# Patient Record
Sex: Female | Born: 1980 | Race: Black or African American | Hispanic: No | Marital: Single | Smoking: Current every day smoker
Health system: Southern US, Community
[De-identification: ages and names within clinical notes are randomized; demographics above are authoritative.]

## PROBLEM LIST (undated history)

## (undated) HISTORY — PX: ECTOPIC PREGNANCY SURGERY: SHX613

---

## 2000-05-14 ENCOUNTER — Inpatient Hospital Stay (HOSPITAL_COMMUNITY): Admission: AD | Admit: 2000-05-14 | Discharge: 2000-05-14 | Payer: Self-pay | Admitting: *Deleted

## 2001-03-03 ENCOUNTER — Emergency Department (HOSPITAL_COMMUNITY): Admission: EM | Admit: 2001-03-03 | Discharge: 2001-03-03 | Payer: Self-pay | Admitting: Emergency Medicine

## 2001-08-07 ENCOUNTER — Emergency Department (HOSPITAL_COMMUNITY): Admission: EM | Admit: 2001-08-07 | Discharge: 2001-08-07 | Payer: Self-pay | Admitting: Emergency Medicine

## 2004-05-20 ENCOUNTER — Emergency Department (HOSPITAL_COMMUNITY): Admission: EM | Admit: 2004-05-20 | Discharge: 2004-05-20 | Payer: Self-pay | Admitting: Emergency Medicine

## 2007-10-08 ENCOUNTER — Emergency Department (HOSPITAL_COMMUNITY): Admission: EM | Admit: 2007-10-08 | Discharge: 2007-10-09 | Payer: Self-pay | Admitting: Emergency Medicine

## 2009-08-26 ENCOUNTER — Inpatient Hospital Stay (HOSPITAL_COMMUNITY): Admission: AD | Admit: 2009-08-26 | Discharge: 2009-08-26 | Payer: Self-pay | Admitting: Obstetrics and Gynecology

## 2009-08-28 ENCOUNTER — Inpatient Hospital Stay (HOSPITAL_COMMUNITY): Admission: AD | Admit: 2009-08-28 | Discharge: 2009-08-28 | Payer: Self-pay | Admitting: Obstetrics & Gynecology

## 2009-08-28 ENCOUNTER — Ambulatory Visit: Payer: Self-pay | Admitting: Obstetrics & Gynecology

## 2010-08-29 LAB — COMPREHENSIVE METABOLIC PANEL
CO2: 26 mEq/L (ref 19–32)
Calcium: 9 mg/dL (ref 8.4–10.5)
Creatinine, Ser: 0.67 mg/dL (ref 0.4–1.2)
Glucose, Bld: 82 mg/dL (ref 70–99)
Total Protein: 7 g/dL (ref 6.0–8.3)

## 2010-08-29 LAB — DIFFERENTIAL
Eosinophils Absolute: 0.1 10*3/uL (ref 0.0–0.7)
Lymphocytes Relative: 22 % (ref 12–46)
Lymphs Abs: 2.4 10*3/uL (ref 0.7–4.0)
Monocytes Absolute: 1 10*3/uL (ref 0.1–1.0)
Monocytes Relative: 9 % (ref 3–12)
Neutro Abs: 7.3 10*3/uL (ref 1.7–7.7)

## 2010-08-29 LAB — CBC
HCT: 37.5 % (ref 36.0–46.0)
HCT: 38.7 % (ref 36.0–46.0)
Hemoglobin: 12.5 g/dL (ref 12.0–15.0)
MCHC: 34 g/dL (ref 30.0–36.0)
MCV: 86.1 fL (ref 78.0–100.0)
Platelets: 310 10*3/uL (ref 150–400)
Platelets: 312 10*3/uL (ref 150–400)
RBC: 4.32 MIL/uL (ref 3.87–5.11)
RBC: 4.49 MIL/uL (ref 3.87–5.11)
RDW: 13.1 % (ref 11.5–15.5)

## 2010-08-29 LAB — GC/CHLAMYDIA PROBE AMP, GENITAL: GC Probe Amp, Genital: NEGATIVE

## 2010-08-29 LAB — HCG, QUANTITATIVE, PREGNANCY
hCG, Beta Chain, Quant, S: 3645 m[IU]/mL — ABNORMAL HIGH (ref <5)
hCG, Beta Chain, Quant, S: 5236 m[IU]/mL — ABNORMAL HIGH (ref <5)

## 2010-08-29 LAB — WET PREP, GENITAL: Yeast Wet Prep HPF POC: NONE SEEN

## 2011-03-04 LAB — URINALYSIS, ROUTINE W REFLEX MICROSCOPIC
Glucose, UA: NEGATIVE
Hgb urine dipstick: NEGATIVE
Ketones, ur: >80 — AB
Nitrite: NEGATIVE
Protein, ur: 30 — AB
Urobilinogen, UA: 1
pH: 6

## 2011-03-04 LAB — POCT PREGNANCY, URINE: Operator id: 222501

## 2011-03-04 LAB — URINE MICROSCOPIC-ADD ON

## 2012-10-01 ENCOUNTER — Encounter (HOSPITAL_COMMUNITY): Payer: Self-pay

## 2012-10-01 ENCOUNTER — Emergency Department (HOSPITAL_COMMUNITY)
Admission: EM | Admit: 2012-10-01 | Discharge: 2012-10-01 | Disposition: A | Discharge status: Home / Self Care | Payer: BC Managed Care – PPO | Attending: Emergency Medicine | Admitting: Emergency Medicine

## 2012-10-01 DIAGNOSIS — J069 Acute upper respiratory infection, unspecified: Secondary | ICD-10-CM

## 2012-10-01 DIAGNOSIS — IMO0001 Reserved for inherently not codable concepts without codable children: Secondary | ICD-10-CM | POA: Insufficient documentation

## 2012-10-01 DIAGNOSIS — H9209 Otalgia, unspecified ear: Secondary | ICD-10-CM | POA: Insufficient documentation

## 2012-10-01 DIAGNOSIS — R5381 Other malaise: Secondary | ICD-10-CM | POA: Insufficient documentation

## 2012-10-01 DIAGNOSIS — J3489 Other specified disorders of nose and nasal sinuses: Secondary | ICD-10-CM | POA: Insufficient documentation

## 2012-10-01 DIAGNOSIS — J029 Acute pharyngitis, unspecified: Secondary | ICD-10-CM | POA: Insufficient documentation

## 2012-10-01 MED ORDER — GUAIFENESIN-CODEINE 100-10 MG/5ML PO SYRP: 5.0000 mL | ORAL_SOLUTION | Freq: Three times a day (TID) | ORAL | Status: DC | PRN

## 2012-10-01 MED ORDER — AZITHROMYCIN 250 MG PO TABS: 250.0000 mg | ORAL_TABLET | Freq: Every day | ORAL | Status: DC

## 2012-10-01 NOTE — ED Provider Notes (Signed)
History     CSN: [id number]  Arrival date & time 10/01/12  1308   First MD Initiated Contact with Patient 10/01/12 1314      Chief Complaint  Patient presents with  . URI    (Consider location/radiation/quality/duration/timing/severity/associated sxs/prior treatment) Patient is a 32 y.o. female presenting with URI. The history is provided by the patient.  URI Presenting symptoms: congestion, cough, ear pain, fatigue and sore throat   Severity:  Moderate Onset quality:  Gradual Duration:  4 days Timing:  Constant Progression:  Worsening Chronicity:  New Relieved by:  Nothing Worsened by:  Nothing tried Ineffective treatments:  Decongestant Associated symptoms: myalgias   Associated symptoms: no neck pain and no sinus pain     No past medical history on file.  Past Surgical History  Procedure Laterality Date  . Ectopic pregnancy surgery      No family history on file.  History  Substance Use Topics  . Smoking status: Not on file  . Smokeless tobacco: Not on file  . Alcohol Use: Not on file    OB History   Grav Para Term Preterm Abortions TAB SAB Ect Mult Living                  Review of Systems  Constitutional: Positive for fatigue.  HENT: Positive for ear pain, congestion and sore throat. Negative for neck pain.   Respiratory: Positive for cough.   Musculoskeletal: Positive for myalgias.  All other systems reviewed and are negative.    Allergies  Penicillins  Home Medications   Current Outpatient Rx  Name  Route  Sig  Dispense  Refill  . Alum & Mag Hydroxide-Simeth (MAGIC MOUTHWASH) SOLN   Oral   Take 10 mLs by mouth daily.         Marland Kitchen guaiFENesin (MUCINEX) 600 MG 12 hr tablet   Oral   Take 1,200 mg by mouth as needed for congestion.         Marland Kitchen ibuprofen (ADVIL,MOTRIN) 200 MG tablet   Oral   Take 200 mg by mouth every 6 (six) hours as needed for pain.         Marland Kitchen azithromycin (ZITHROMAX) 250 MG tablet   Oral   Take 1 tablet (250 mg  total) by mouth daily. Take first 2 tablets together, then 1 every day until finished.   6 tablet   0   . guaiFENesin-codeine (ROBITUSSIN AC) 100-10 MG/5ML syrup   Oral   Take 5 mLs by mouth 3 (three) times daily as needed for cough.   120 mL   0     BP 111/69  Pulse 110  Temp(Src) 99.8 F (37.7 C) (Oral)  SpO2 100%  LMP 09/24/2012  Physical Exam  Nursing note and vitals reviewed. Constitutional: She is oriented to person, place, and time. She appears well-developed and well-nourished. No distress.  HENT:  Head: Normocephalic and atraumatic.  Neck: Normal range of motion. Neck supple.  Cardiovascular: Normal rate and regular rhythm.  Exam reveals no gallop and no friction rub.   No murmur heard. Pulmonary/Chest: Effort normal and breath sounds normal. No respiratory distress. She has no wheezes.  Abdominal: Soft. Bowel sounds are normal. She exhibits no distension. There is no tenderness.  Musculoskeletal: Normal range of motion.  Neurological: She is alert and oriented to person, place, and time.  Skin: Skin is warm and dry. She is not diaphoretic.    ED Course  Procedures (including critical care time)  Labs  Reviewed - No data to display No results found.   1. URI (upper respiratory infection)       MDM  Symptoms likely viral in nature.  Will treat with robitussin ac, continue with decongestants.  Mom is concerned that she will not improve without an antibiotic.  Will prescribe zmax, to fill only if not improving in the next 2-3 days.        Geoffery Lyons, MD 10/01/12 [phone]

## 2012-10-01 NOTE — ED Notes (Signed)
She c/o occasional chills, plus cough productive of green phlegm x 3 days.  She is in no distress.

## 2014-01-02 ENCOUNTER — Observation Stay (HOSPITAL_COMMUNITY): Payer: BC Managed Care – PPO | Admitting: Anesthesiology

## 2014-01-02 ENCOUNTER — Observation Stay (HOSPITAL_COMMUNITY): Payer: BC Managed Care – PPO

## 2014-01-02 ENCOUNTER — Encounter (HOSPITAL_COMMUNITY)
Admission: EM | Disposition: A | Discharge status: Home / Self Care | Payer: Self-pay | Source: Home / Self Care | Attending: Orthopedic Surgery

## 2014-01-02 ENCOUNTER — Encounter (HOSPITAL_COMMUNITY): Payer: Self-pay | Admitting: Emergency Medicine

## 2014-01-02 ENCOUNTER — Emergency Department (HOSPITAL_COMMUNITY): Payer: BC Managed Care – PPO

## 2014-01-02 ENCOUNTER — Encounter (HOSPITAL_COMMUNITY): Payer: BC Managed Care – PPO | Admitting: Certified Registered Nurse Anesthetist

## 2014-01-02 ENCOUNTER — Inpatient Hospital Stay (HOSPITAL_COMMUNITY)
Admission: EM | Admit: 2014-01-02 | Discharge: 2014-01-05 | DRG: 504 | Disposition: A | Discharge status: Home / Self Care | Payer: BC Managed Care – PPO | Attending: Orthopedic Surgery | Admitting: Orthopedic Surgery

## 2014-01-02 ENCOUNTER — Observation Stay (HOSPITAL_COMMUNITY): Payer: BC Managed Care – PPO | Admitting: Certified Registered Nurse Anesthetist

## 2014-01-02 ENCOUNTER — Encounter (HOSPITAL_COMMUNITY): Payer: BC Managed Care – PPO | Admitting: Anesthesiology

## 2014-01-02 DIAGNOSIS — F1010 Alcohol abuse, uncomplicated: Secondary | ICD-10-CM | POA: Diagnosis present

## 2014-01-02 DIAGNOSIS — F10929 Alcohol use, unspecified with intoxication, unspecified: Secondary | ICD-10-CM | POA: Diagnosis present

## 2014-01-02 DIAGNOSIS — D62 Acute posthemorrhagic anemia: Secondary | ICD-10-CM | POA: Diagnosis not present

## 2014-01-02 DIAGNOSIS — S92101A Unspecified fracture of right talus, initial encounter for closed fracture: Secondary | ICD-10-CM

## 2014-01-02 DIAGNOSIS — S91009A Unspecified open wound, unspecified ankle, initial encounter: Secondary | ICD-10-CM

## 2014-01-02 DIAGNOSIS — S81011A Laceration without foreign body, right knee, initial encounter: Secondary | ICD-10-CM

## 2014-01-02 DIAGNOSIS — S81809A Unspecified open wound, unspecified lower leg, initial encounter: Secondary | ICD-10-CM

## 2014-01-02 DIAGNOSIS — Z880 Allergy status to penicillin: Secondary | ICD-10-CM

## 2014-01-02 DIAGNOSIS — S92109A Unspecified fracture of unspecified talus, initial encounter for closed fracture: Principal | ICD-10-CM

## 2014-01-02 DIAGNOSIS — F10920 Alcohol use, unspecified with intoxication, uncomplicated: Secondary | ICD-10-CM

## 2014-01-02 DIAGNOSIS — F17200 Nicotine dependence, unspecified, uncomplicated: Secondary | ICD-10-CM | POA: Diagnosis present

## 2014-01-02 DIAGNOSIS — S81009A Unspecified open wound, unspecified knee, initial encounter: Secondary | ICD-10-CM | POA: Diagnosis present

## 2014-01-02 HISTORY — PX: ORIF ANKLE FRACTURE: SHX5408

## 2014-01-02 LAB — CBC WITH DIFFERENTIAL/PLATELET
BASOS ABS: 0.1 10*3/uL (ref 0.0–0.1)
Basophils Relative: 0 % (ref 0–1)
EOS ABS: 0.1 10*3/uL (ref 0.0–0.7)
Eosinophils Relative: 1 % (ref 0–5)
HCT: 39.2 % (ref 36.0–46.0)
Hemoglobin: 13.9 g/dL (ref 12.0–15.0)
LYMPHS ABS: 5.8 10*3/uL — AB (ref 0.7–4.0)
Lymphocytes Relative: 51 % — ABNORMAL HIGH (ref 12–46)
MCH: 29.5 pg (ref 26.0–34.0)
MCHC: 35.5 g/dL (ref 30.0–36.0)
MCV: 83.2 fL (ref 78.0–100.0)
Monocytes Absolute: 0.8 10*3/uL (ref 0.1–1.0)
Monocytes Relative: 7 % (ref 3–12)
NEUTROS PCT: 41 % — AB (ref 43–77)
Neutro Abs: 4.6 10*3/uL (ref 1.7–7.7)
PLATELETS: 346 10*3/uL (ref 150–400)
RBC: 4.71 MIL/uL (ref 3.87–5.11)
RDW: 13 % (ref 11.5–15.5)
WBC: 11.4 10*3/uL — AB (ref 4.0–10.5)

## 2014-01-02 LAB — SURGICAL PCR SCREEN
MRSA, PCR: NEGATIVE
Staphylococcus aureus: NEGATIVE

## 2014-01-02 LAB — URINALYSIS, ROUTINE W REFLEX MICROSCOPIC
Bilirubin Urine: NEGATIVE
Glucose, UA: NEGATIVE mg/dL
Ketones, ur: NEGATIVE mg/dL
Leukocytes, UA: NEGATIVE
NITRITE: NEGATIVE
Protein, ur: NEGATIVE mg/dL
SPECIFIC GRAVITY, URINE: 1.011 (ref 1.005–1.030)
UROBILINOGEN UA: 0.2 mg/dL (ref 0.0–1.0)
pH: 6 (ref 5.0–8.0)

## 2014-01-02 LAB — BASIC METABOLIC PANEL
ANION GAP: 17 — AB (ref 5–15)
BUN: 11 mg/dL (ref 6–23)
CALCIUM: 8.8 mg/dL (ref 8.4–10.5)
CO2: 24 mEq/L (ref 19–32)
Chloride: 104 mEq/L (ref 96–112)
Creatinine, Ser: 0.65 mg/dL (ref 0.50–1.10)
GFR calc Af Amer: >90 mL/min (ref >90)
GFR calc non Af Amer: >90 mL/min (ref >90)
GLUCOSE: 94 mg/dL (ref 70–99)
POTASSIUM: 4 meq/L (ref 3.7–5.3)
SODIUM: 145 meq/L (ref 137–147)

## 2014-01-02 LAB — URINE MICROSCOPIC-ADD ON

## 2014-01-02 LAB — ETHANOL: Alcohol, Ethyl (B): 226 mg/dL — ABNORMAL HIGH (ref 0–11)

## 2014-01-02 LAB — HCG, SERUM, QUALITATIVE: PREG SERUM: NEGATIVE

## 2014-01-02 LAB — PREGNANCY, URINE: PREG TEST UR: NEGATIVE

## 2014-01-02 SURGERY — CLOSED REDUCTION, ANKLE
Anesthesia: General | Site: Ankle | Laterality: Right

## 2014-01-02 SURGERY — OPEN REDUCTION INTERNAL FIXATION (ORIF) ANKLE FRACTURE
Anesthesia: General | Laterality: Right

## 2014-01-02 MED ORDER — METHOCARBAMOL 1000 MG/10ML IJ SOLN
500.0000 mg | Freq: Four times a day (QID) | INTRAMUSCULAR | Status: DC | PRN
  Filled 2014-01-02: qty 10

## 2014-01-02 MED ORDER — ONDANSETRON HCL 4 MG/2ML IJ SOLN
4.0000 mg | Freq: Four times a day (QID) | INTRAMUSCULAR | Status: DC | PRN
Start: 2014-01-02 — End: 2014-01-05
  Administered 2014-01-02: 4 mg via INTRAVENOUS

## 2014-01-02 MED ORDER — NALOXONE HCL 0.4 MG/ML IJ SOLN: 0.4000 mg | INTRAMUSCULAR | Status: DC | PRN

## 2014-01-02 MED ORDER — HYDROMORPHONE HCL PF 1 MG/ML IJ SOLN
0.2500 mg | INTRAMUSCULAR | Status: DC | PRN
  Administered 2014-01-02 (×4): 0.5 mg via INTRAVENOUS

## 2014-01-02 MED ORDER — MORPHINE SULFATE (PF) 1 MG/ML IV SOLN
INTRAVENOUS | Status: DC
  Administered 2014-01-02: 21:00:00 via INTRAVENOUS
  Administered 2014-01-02: 5 mg via INTRAVENOUS
  Administered 2014-01-02: 14:00:00 via INTRAVENOUS
  Administered 2014-01-03: 25 mg via INTRAVENOUS
  Administered 2014-01-03: 1 mg via INTRAVENOUS
  Administered 2014-01-03: 04:00:00 via INTRAVENOUS
  Administered 2014-01-03: 1 mg via INTRAVENOUS
  Administered 2014-01-04: 15 mg via INTRAVENOUS
  Administered 2014-01-04: via INTRAVENOUS
  Filled 2014-01-02 (×4): qty 25

## 2014-01-02 MED ORDER — ACETAMINOPHEN 10 MG/ML IV SOLN
1000.0000 mg | Freq: Four times a day (QID) | INTRAVENOUS | Status: AC
  Administered 2014-01-02 – 2014-01-03 (×4): 1000 mg via INTRAVENOUS
  Filled 2014-01-02 (×2): qty 100

## 2014-01-02 MED ORDER — HYDROMORPHONE HCL PF 1 MG/ML IJ SOLN
1.0000 mg | Freq: Once | INTRAMUSCULAR | Status: AC
  Administered 2014-01-02: 1 mg via INTRAVENOUS
  Filled 2014-01-02: qty 1

## 2014-01-02 MED ORDER — MAGNESIUM CITRATE PO SOLN: 1.0000 | Freq: Once | ORAL | Status: AC | PRN

## 2014-01-02 MED ORDER — CEFAZOLIN SODIUM-DEXTROSE 2-3 GM-% IV SOLR
2.0000 g | Freq: Once | INTRAVENOUS | Status: AC
  Administered 2014-01-02: 2 g via INTRAVENOUS
  Filled 2014-01-02: qty 50

## 2014-01-02 MED ORDER — LACTATED RINGERS IV SOLN
INTRAVENOUS | Status: DC | PRN
  Administered 2014-01-02 (×3): via INTRAVENOUS

## 2014-01-02 MED ORDER — DOCUSATE SODIUM 100 MG PO CAPS
100.0000 mg | ORAL_CAPSULE | Freq: Two times a day (BID) | ORAL | Status: DC
  Administered 2014-01-02 – 2014-01-05 (×6): 100 mg via ORAL
  Filled 2014-01-02 (×8): qty 1

## 2014-01-02 MED ORDER — DIPHENHYDRAMINE HCL 12.5 MG/5ML PO ELIX
12.5000 mg | ORAL_SOLUTION | Freq: Four times a day (QID) | ORAL | Status: DC | PRN
  Administered 2014-01-04: 25 mg via ORAL
  Filled 2014-01-02: qty 10

## 2014-01-02 MED ORDER — METHYLENE BLUE 1 % INJ SOLN
INTRAMUSCULAR | Status: DC | PRN
  Administered 2014-01-02: 10 mL via SUBMUCOSAL

## 2014-01-02 MED ORDER — ROCURONIUM BROMIDE 100 MG/10ML IV SOLN
INTRAVENOUS | Status: DC | PRN
  Administered 2014-01-02: 10 mg via INTRAVENOUS
  Administered 2014-01-02: 30 mg via INTRAVENOUS
  Administered 2014-01-02: 10 mg via INTRAVENOUS
  Administered 2014-01-02: 20 mg via INTRAVENOUS

## 2014-01-02 MED ORDER — LIDOCAINE HCL (CARDIAC) 20 MG/ML IV SOLN
INTRAVENOUS | Status: DC | PRN
  Administered 2014-01-02: 100 mg via INTRAVENOUS

## 2014-01-02 MED ORDER — POLYETHYLENE GLYCOL 3350 17 G PO PACK: 17.0000 g | PACK | Freq: Every day | ORAL | Status: DC | PRN

## 2014-01-02 MED ORDER — DIPHENHYDRAMINE HCL 50 MG/ML IJ SOLN: 12.5000 mg | Freq: Four times a day (QID) | INTRAMUSCULAR | Status: DC | PRN

## 2014-01-02 MED ORDER — BISACODYL 5 MG PO TBEC: 5.0000 mg | DELAYED_RELEASE_TABLET | Freq: Every day | ORAL | Status: DC | PRN

## 2014-01-02 MED ORDER — GLYCOPYRROLATE 0.2 MG/ML IJ SOLN
INTRAMUSCULAR | Status: DC | PRN
  Administered 2014-01-02: 0.2 mg via INTRAVENOUS

## 2014-01-02 MED ORDER — ONDANSETRON HCL 4 MG/2ML IJ SOLN
4.0000 mg | Freq: Four times a day (QID) | INTRAMUSCULAR | Status: DC | PRN
  Filled 2014-01-02: qty 2

## 2014-01-02 MED ORDER — FENTANYL CITRATE 0.05 MG/ML IJ SOLN
INTRAMUSCULAR | Status: AC
  Filled 2014-01-02: qty 2

## 2014-01-02 MED ORDER — FENTANYL CITRATE 0.05 MG/ML IJ SOLN
INTRAMUSCULAR | Status: DC | PRN
  Administered 2014-01-02: 25 ug via INTRAVENOUS
  Administered 2014-01-02: 100 ug via INTRAVENOUS
  Administered 2014-01-02 (×3): 25 ug via INTRAVENOUS
  Administered 2014-01-02: 50 ug via INTRAVENOUS

## 2014-01-02 MED ORDER — FENTANYL CITRATE 0.05 MG/ML IJ SOLN
INTRAMUSCULAR | Status: AC | PRN
  Administered 2014-01-02 (×2): 50 ug via INTRAVENOUS

## 2014-01-02 MED ORDER — OXYCODONE HCL 5 MG PO TABS
5.0000 mg | ORAL_TABLET | ORAL | Status: DC | PRN
  Administered 2014-01-03 – 2014-01-04 (×2): 10 mg via ORAL
  Filled 2014-01-02 (×2): qty 2

## 2014-01-02 MED ORDER — MORPHINE SULFATE (PF) 1 MG/ML IV SOLN
INTRAVENOUS | Status: AC
  Filled 2014-01-02: qty 25

## 2014-01-02 MED ORDER — ONDANSETRON HCL 4 MG/2ML IJ SOLN: 4.0000 mg | Freq: Four times a day (QID) | INTRAMUSCULAR | Status: DC | PRN

## 2014-01-02 MED ORDER — METOCLOPRAMIDE HCL 10 MG PO TABS: 5.0000 mg | ORAL_TABLET | Freq: Three times a day (TID) | ORAL | Status: DC | PRN

## 2014-01-02 MED ORDER — METOCLOPRAMIDE HCL 5 MG/ML IJ SOLN: 5.0000 mg | Freq: Three times a day (TID) | INTRAMUSCULAR | Status: DC | PRN

## 2014-01-02 MED ORDER — ONDANSETRON HCL 4 MG/2ML IJ SOLN
INTRAMUSCULAR | Status: DC | PRN
  Administered 2014-01-02: 4 mg via INTRAVENOUS

## 2014-01-02 MED ORDER — PROPOFOL 10 MG/ML IV BOLUS
INTRAVENOUS | Status: DC | PRN
  Administered 2014-01-02: 160 mg via INTRAVENOUS

## 2014-01-02 MED ORDER — HYDROMORPHONE HCL PF 1 MG/ML IJ SOLN: 1.0000 mg | INTRAMUSCULAR | Status: DC | PRN

## 2014-01-02 MED ORDER — MIDAZOLAM HCL 2 MG/2ML IJ SOLN
INTRAMUSCULAR | Status: AC
  Filled 2014-01-02: qty 2

## 2014-01-02 MED ORDER — POTASSIUM CHLORIDE IN NACL 20-0.9 MEQ/L-% IV SOLN
INTRAVENOUS | Status: DC
  Administered 2014-01-03: 13:00:00 via INTRAVENOUS
  Filled 2014-01-02 (×3): qty 1000

## 2014-01-02 MED ORDER — PNEUMOCOCCAL VAC POLYVALENT 25 MCG/0.5ML IJ INJ
0.5000 mL | INJECTION | INTRAMUSCULAR | Status: AC
  Administered 2014-01-03: 0.5 mL via INTRAMUSCULAR
  Filled 2014-01-02 (×2): qty 0.5

## 2014-01-02 MED ORDER — PHENYLEPHRINE HCL 10 MG/ML IJ SOLN
INTRAMUSCULAR | Status: DC | PRN
  Administered 2014-01-02 (×3): 80 ug via INTRAVENOUS
  Administered 2014-01-02: 40 ug via INTRAVENOUS
  Administered 2014-01-02: 80 ug via INTRAVENOUS
  Administered 2014-01-02: 40 ug via INTRAVENOUS

## 2014-01-02 MED ORDER — METHOCARBAMOL 500 MG PO TABS
500.0000 mg | ORAL_TABLET | Freq: Four times a day (QID) | ORAL | Status: DC | PRN
  Administered 2014-01-02: 1000 mg via ORAL
  Administered 2014-01-03: 500 mg via ORAL
  Administered 2014-01-04 (×3): 1000 mg via ORAL
  Administered 2014-01-04 – 2014-01-05 (×3): 500 mg via ORAL
  Filled 2014-01-02 (×3): qty 2
  Filled 2014-01-02 (×2): qty 1
  Filled 2014-01-02: qty 2
  Filled 2014-01-02 (×2): qty 1

## 2014-01-02 MED ORDER — CEFAZOLIN SODIUM 1-5 GM-% IV SOLN
1.0000 g | Freq: Four times a day (QID) | INTRAVENOUS | Status: AC
  Administered 2014-01-02 – 2014-01-03 (×3): 1 g via INTRAVENOUS
  Filled 2014-01-02: qty 50

## 2014-01-02 MED ORDER — SUCCINYLCHOLINE CHLORIDE 20 MG/ML IJ SOLN
INTRAMUSCULAR | Status: DC | PRN
  Administered 2014-01-02: 80 mg via INTRAVENOUS

## 2014-01-02 MED ORDER — VALACYCLOVIR HCL 500 MG PO TABS
500.0000 mg | ORAL_TABLET | Freq: Every day | ORAL | Status: DC
  Administered 2014-01-02 – 2014-01-05 (×4): 500 mg via ORAL
  Filled 2014-01-02 (×4): qty 1

## 2014-01-02 MED ORDER — 0.9 % SODIUM CHLORIDE (POUR BTL) OPTIME
TOPICAL | Status: DC | PRN
  Administered 2014-01-02: 1000 mL

## 2014-01-02 MED ORDER — SODIUM CHLORIDE 0.9 % IJ SOLN: 9.0000 mL | INTRAMUSCULAR | Status: DC | PRN

## 2014-01-02 MED ORDER — METHYLENE BLUE 1 % INJ SOLN
INTRAMUSCULAR | Status: AC
  Filled 2014-01-02: qty 10

## 2014-01-02 MED ORDER — ENOXAPARIN SODIUM 40 MG/0.4ML ~~LOC~~ SOLN
40.0000 mg | SUBCUTANEOUS | Status: DC
  Administered 2014-01-03 – 2014-01-04 (×2): 40 mg via SUBCUTANEOUS
  Filled 2014-01-02 (×3): qty 0.4

## 2014-01-02 MED ORDER — HYDROMORPHONE HCL PF 1 MG/ML IJ SOLN
INTRAMUSCULAR | Status: AC
  Administered 2014-01-02: 0.5 mg via INTRAVENOUS
  Filled 2014-01-02: qty 2

## 2014-01-02 MED ORDER — ACETAMINOPHEN 10 MG/ML IV SOLN
INTRAVENOUS | Status: AC
  Filled 2014-01-02: qty 100

## 2014-01-02 MED ORDER — NEOSTIGMINE METHYLSULFATE 10 MG/10ML IV SOLN
INTRAVENOUS | Status: DC | PRN
  Administered 2014-01-02: 2 mg via INTRAVENOUS

## 2014-01-02 MED ORDER — BUPIVACAINE HCL (PF) 0.25 % IJ SOLN
INTRAMUSCULAR | Status: AC
  Filled 2014-01-02: qty 30

## 2014-01-02 MED ORDER — FENTANYL CITRATE 0.05 MG/ML IJ SOLN
INTRAMUSCULAR | Status: AC
  Filled 2014-01-02: qty 5

## 2014-01-02 MED ORDER — ONDANSETRON HCL 4 MG/2ML IJ SOLN: 4.0000 mg | Freq: Once | INTRAMUSCULAR | Status: DC | PRN

## 2014-01-02 MED ORDER — ONDANSETRON HCL 4 MG PO TABS
4.0000 mg | ORAL_TABLET | Freq: Four times a day (QID) | ORAL | Status: DC | PRN
  Administered 2014-01-03: 4 mg via ORAL
  Filled 2014-01-02: qty 1

## 2014-01-02 MED ORDER — PROPOFOL 10 MG/ML IV BOLUS
INTRAVENOUS | Status: AC
  Filled 2014-01-02: qty 20

## 2014-01-02 MED ORDER — CEFAZOLIN SODIUM 1-5 GM-% IV SOLN
INTRAVENOUS | Status: AC
  Filled 2014-01-02: qty 50

## 2014-01-02 SURGICAL SUPPLY — 72 items
BANDAGE ELASTIC 4 VELCRO ST LF (GAUZE/BANDAGES/DRESSINGS) ×3 IMPLANT
BANDAGE ELASTIC 6 VELCRO ST LF (GAUZE/BANDAGES/DRESSINGS) ×2 IMPLANT
BANDAGE ESMARK 6X9 LF (GAUZE/BANDAGES/DRESSINGS) ×1 IMPLANT
BANDAGE GAUZE ELAST BULKY 4 IN (GAUZE/BANDAGES/DRESSINGS) IMPLANT
BIT DRILL 100X2XQC STRL (BIT) IMPLANT
BIT DRILL 110MM 85MM (BIT) IMPLANT
BIT DRILL QC 2.0X100 (BIT) ×3
BIT DRL 100X2XQC STRL (BIT) ×1
BLADE SURG 10 STRL SS (BLADE) ×3 IMPLANT
BNDG CMPR 9X6 STRL LF SNTH (GAUZE/BANDAGES/DRESSINGS) ×1
BNDG COHESIVE 4X5 TAN STRL (GAUZE/BANDAGES/DRESSINGS) ×3 IMPLANT
BNDG ESMARK 6X9 LF (GAUZE/BANDAGES/DRESSINGS) ×3
BNDG GAUZE ELAST 4 BULKY (GAUZE/BANDAGES/DRESSINGS) ×2 IMPLANT
BRUSH SCRUB DISP (MISCELLANEOUS) ×6 IMPLANT
COVER SURGICAL LIGHT HANDLE (MISCELLANEOUS) ×6 IMPLANT
CUFF TOURNIQUET SINGLE 34IN LL (TOURNIQUET CUFF) ×3 IMPLANT
DRAPE C-ARM 42X72 X-RAY (DRAPES) IMPLANT
DRAPE C-ARMOR (DRAPES) ×6 IMPLANT
DRAPE ORTHO SPLIT 77X108 STRL (DRAPES) ×9
DRAPE PROXIMA HALF (DRAPES) ×3 IMPLANT
DRAPE SURG ORHT 6 SPLT 77X108 (DRAPES) ×3 IMPLANT
DRAPE U-SHAPE 47X51 STRL (DRAPES) ×3 IMPLANT
DRILL BIT 110MM/85MM (BIT) ×2
DRSG EMULSION OIL 3X3 NADH (GAUZE/BANDAGES/DRESSINGS) IMPLANT
DRSG MEPITEL 4X7.2 (GAUZE/BANDAGES/DRESSINGS) ×2 IMPLANT
ELECT REM PT RETURN 9FT ADLT (ELECTROSURGICAL) ×3
ELECTRODE REM PT RTRN 9FT ADLT (ELECTROSURGICAL) ×1 IMPLANT
GLOVE BIO SURGEON STRL SZ7.5 (GLOVE) ×3 IMPLANT
GLOVE BIO SURGEON STRL SZ8 (GLOVE) ×3 IMPLANT
GLOVE BIOGEL PI IND STRL 7.5 (GLOVE) ×1 IMPLANT
GLOVE BIOGEL PI IND STRL 8 (GLOVE) ×1 IMPLANT
GLOVE BIOGEL PI INDICATOR 7.5 (GLOVE) ×2
GLOVE BIOGEL PI INDICATOR 8 (GLOVE) ×2
GOWN STRL REUS W/ TWL LRG LVL3 (GOWN DISPOSABLE) ×2 IMPLANT
GOWN STRL REUS W/ TWL XL LVL3 (GOWN DISPOSABLE) ×1 IMPLANT
GOWN STRL REUS W/TWL LRG LVL3 (GOWN DISPOSABLE) ×6
GOWN STRL REUS W/TWL XL LVL3 (GOWN DISPOSABLE) ×3
K-WIRE 1.25 TRCR POINT 150 (WIRE) ×15
KIT BASIN OR (CUSTOM PROCEDURE TRAY) ×3 IMPLANT
KIT ROOM TURNOVER OR (KITS) ×3 IMPLANT
KWIRE 1.25 TRCR POINT 150 (WIRE) ×5 IMPLANT
MANIFOLD NEPTUNE II (INSTRUMENTS) ×3 IMPLANT
NEEDLE HYPO 21X1.5 SAFETY (NEEDLE) IMPLANT
NS IRRIG 1000ML POUR BTL (IV SOLUTION) ×3 IMPLANT
PACK GENERAL/GYN (CUSTOM PROCEDURE TRAY) ×3 IMPLANT
PAD ARMBOARD 7.5X6 YLW CONV (MISCELLANEOUS) ×6 IMPLANT
PAD CAST 4YDX4 CTTN HI CHSV (CAST SUPPLIES) ×1 IMPLANT
PADDING CAST COTTON 4X4 STRL (CAST SUPPLIES) ×3
PADDING CAST COTTON 6X4 STRL (CAST SUPPLIES) ×3 IMPLANT
PENCIL BUTTON HOLSTER BLD 10FT (ELECTRODE) ×3 IMPLANT
PLATE CONDYLAR 2.0 7H 39M L (Plate) ×6 IMPLANT
SCREW CORTEX 2.0 24MM (Screw) ×3 IMPLANT
SCREW CORTEX 2.0 26MM (Screw) ×12 IMPLANT
SPONGE GAUZE 4X4 12PLY (GAUZE/BANDAGES/DRESSINGS) IMPLANT
SPONGE LAP 18X18 X RAY DECT (DISPOSABLE) ×9 IMPLANT
SPONGE SCRUB IODOPHOR (GAUZE/BANDAGES/DRESSINGS) ×3 IMPLANT
STAPLER VISISTAT 35W (STAPLE) IMPLANT
SUCTION FRAZIER TIP 10 FR DISP (SUCTIONS) ×3 IMPLANT
SUT ETHILON 2 0 FS 18 (SUTURE) ×9 IMPLANT
SUT ETHILON 3 0 PS 1 (SUTURE) ×6 IMPLANT
SUT PDS AB 2-0 CT1 27 (SUTURE) IMPLANT
SUT VIC AB 2-0 CT1 27 (SUTURE) ×6
SUT VIC AB 2-0 CT1 TAPERPNT 27 (SUTURE) ×2 IMPLANT
SUT VIC AB 2-0 CT3 27 (SUTURE) IMPLANT
SYR CONTROL 10ML LL (SYRINGE) IMPLANT
TOWEL OR 17X24 6PK STRL BLUE (TOWEL DISPOSABLE) ×3 IMPLANT
TOWEL OR 17X26 10 PK STRL BLUE (TOWEL DISPOSABLE) ×6 IMPLANT
TRAY FOLEY CATH 16FR SILVER (SET/KITS/TRAYS/PACK) ×3 IMPLANT
TUBE CONNECTING 12'X1/4 (SUCTIONS) ×1
TUBE CONNECTING 12X1/4 (SUCTIONS) ×2 IMPLANT
UNDERPAD 30X30 INCONTINENT (UNDERPADS AND DIAPERS) ×3 IMPLANT
WATER STERILE IRR 1000ML POUR (IV SOLUTION) ×3 IMPLANT

## 2014-01-02 NOTE — Anesthesia Postprocedure Evaluation (Signed)
  Anesthesia Post-op Note  Patient: Linda [name]  Procedure(s) Performed: Procedure(s): OPEN REDUCTION INTERNAL FIXATION (ORIF) TALUS FRACTURE (Right)  Patient Location: PACU  Anesthesia Type:General  Level of Consciousness: awake, alert , oriented and patient cooperative  Airway and Oxygen Therapy: Patient Spontanous Breathing  Post-op Pain: moderate  Post-op Assessment: Post-op Vital signs reviewed, Patient's Cardiovascular Status Stable, Respiratory Function Stable, Patent Airway, No signs of Nausea or vomiting and Pain level controlled  Post-op Vital Signs: stable  Last Vitals:  Filed Vitals:   01/02/14 1432  BP: 121/69  Pulse: 108  Temp: 37.4 C  Resp: 16    Complications: No apparent anesthesia complications
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
## 2014-01-02 NOTE — Transfer of Care (Signed)
Immediate Anesthesia Transfer of Care Note  Patient: Linda [name]  Procedure(s) Performed: Procedure(s): OPEN REDUCTION INTERNAL FIXATION (ORIF) TALUS FRACTURE (Right)  Patient Location: PACU  Anesthesia Type:General  Level of Consciousness: awake, alert , oriented and patient cooperative  Airway & Oxygen Therapy: Patient Spontanous Breathing and Patient connected to nasal cannula oxygen  Post-op Assessment: Report given to PACU RN, Post -op Vital signs reviewed and stable and Patient moving all extremities X 4  Post vital signs: Reviewed and stable  Complications: No apparent anesthesia complications
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
## 2014-01-02 NOTE — Progress Notes (Signed)
Utilization review completed.  

## 2014-01-02 NOTE — Brief Op Note (Signed)
+  01/02/2014  11:59 AM  PATIENT:  Linda ManningKiwanna Pallo  33 y.o. female  PRE-OPERATIVE DIAGNOSIS:   1. Right talus fracture 2. Right subtalar dislocation 3. Right knee laceration  POST-OPERATIVE DIAGNOSIS:  1. Right talus fracture 2. Right subtalar dislocation 3. Right knee laceration 4. Intact knee joint (negative for traumatic arthrotomy)  PROCEDURE:  Procedure(s): 1. OPEN REDUCTION INTERNAL FIXATION (ORIF) TALUS FRACTURE (Right) 2. OPEN REDUCTION RIGHT SUBTALAR DISLOCATION 3. REPAIR OF RIGHT KNEE LACERATION 4. INJECTION OF RIGHT KNEE WITH METHYLENE BLUE and MARCAINE  SURGEON:  Surgeon(s) and Role:    * Budd PalmerMichael H Kc Sedlak, MD - Primary  PHYSICIAN ASSISTANT: Montez MoritaKeith Paul, PA-C  ANESTHESIA:   general  I/O:  Total I/O In: 2000 [I.V.:2000] Out: 600 [Urine:500; Blood:100]  SPECIMEN:  No Specimen  TOURNIQUET:    DICTATION: .Other Dictation: Dictation Number [id number]

## 2014-01-02 NOTE — Anesthesia Procedure Notes (Signed)
Procedure Name: Intubation Date/Time: 01/02/2014 8:31 AM Performed by: Rogelia BogaMUELLER, Saffron Busey P Pre-anesthesia Checklist: Patient identified, Emergency Drugs available, Suction available, Patient being monitored and Timeout performed Patient Re-evaluated:Patient Re-evaluated prior to inductionOxygen Delivery Method: Circle system utilized Preoxygenation: Pre-oxygenation with 100% oxygen Intubation Type: IV induction, Rapid sequence and Cricoid Pressure applied Ventilation: Mask ventilation without difficulty Laryngoscope Size: Mac and 4 Grade View: Grade I Tube type: Oral Tube size: 7.0 mm Number of attempts: 1 Airway Equipment and Method: Patient positioned with wedge pillow Secured at: 21 cm Tube secured with: Tape Dental Injury: Teeth and Oropharynx as per pre-operative assessment

## 2014-01-02 NOTE — Anesthesia Preprocedure Evaluation (Signed)
Anesthesia Evaluation  Patient identified by MRN, date of birth, ID band Patient awake    Reviewed: Allergy & Precautions, H&P , NPO status , Patient's Chart, lab work & pertinent test results  Airway        Dental   Pulmonary Current Smoker,          Cardiovascular     Neuro/Psych    GI/Hepatic   Endo/Other    Renal/GU      Musculoskeletal   Abdominal   Peds  Hematology   Anesthesia Other Findings   Reproductive/Obstetrics                             Anesthesia Physical Anesthesia Plan  ASA: I  Anesthesia Plan: General   Post-op Pain Management:    Induction: Intravenous  Airway Management Planned: LMA and Oral ETT  Additional Equipment:   Intra-op Plan:   Post-operative Plan: Extubation in OR  Informed Consent: I have reviewed the patients History and Physical, chart, labs and discussed the procedure including the risks, benefits and alternatives for the proposed anesthesia with the patient or authorized representative who has indicated his/her understanding and acceptance.     Plan Discussed with: CRNA, Anesthesiologist and Surgeon  Anesthesia Plan Comments:         Anesthesia Quick Evaluation  
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
## 2014-01-02 NOTE — Progress Notes (Signed)
Orthopedic Tech Progress Note Patient Details:  Linda ManningKiwanna [name] 11-Dec-1980 [id number]  Ortho Devices Type of Ortho Device: Short leg splint Ortho Device/Splint Interventions: Application   Haskell Flirtewsome, [name] M 01/02/2014, 3:05 AM

## 2014-01-02 NOTE — Anesthesia Preprocedure Evaluation (Signed)
Anesthesia Evaluation  Patient identified by MRN, date of birth, ID band Patient awake    Reviewed: Allergy & Precautions, H&P , NPO status , Patient's Chart, lab work & pertinent test results, reviewed documented beta blocker date and time   Airway Mallampati: II TM Distance: >3 FB Neck ROM: Full    Dental  (+) Teeth Intact, Dental Advisory Given   Pulmonary Current Smoker,          Cardiovascular     Neuro/Psych    GI/Hepatic   Endo/Other    Renal/GU      Musculoskeletal   Abdominal   Peds  Hematology   Anesthesia Other Findings   Reproductive/Obstetrics                           Anesthesia Physical Anesthesia Plan  ASA: II  Anesthesia Plan: General   Post-op Pain Management:    Induction: Intravenous, Rapid sequence and Cricoid pressure planned  Airway Management Planned: Oral ETT  Additional Equipment:   Intra-op Plan:   Post-operative Plan: Extubation in OR  Informed Consent: I have reviewed the patients History and Physical, chart, labs and discussed the procedure including the risks, benefits and alternatives for the proposed anesthesia with the patient or authorized representative who has indicated his/her understanding and acceptance.   Dental advisory given  Plan Discussed with:   Anesthesia Plan Comments:         Anesthesia Quick Evaluation
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
## 2014-01-03 LAB — BASIC METABOLIC PANEL
Anion gap: 14 (ref 5–15)
BUN: 4 mg/dL — ABNORMAL LOW (ref 6–23)
CHLORIDE: 101 meq/L (ref 96–112)
CO2: 25 meq/L (ref 19–32)
CREATININE: 0.61 mg/dL (ref 0.50–1.10)
Calcium: 8.4 mg/dL (ref 8.4–10.5)
GFR calc Af Amer: >90 mL/min (ref >90)
GFR calc non Af Amer: >90 mL/min (ref >90)
Glucose, Bld: 92 mg/dL (ref 70–99)
Potassium: 3.8 mEq/L (ref 3.7–5.3)
Sodium: 140 mEq/L (ref 137–147)

## 2014-01-03 MED ORDER — ACETAMINOPHEN 500 MG PO TABS
1000.0000 mg | ORAL_TABLET | Freq: Four times a day (QID) | ORAL | Status: DC
Start: 2014-01-03 — End: 2014-01-04
  Administered 2014-01-03 – 2014-01-04 (×3): 1000 mg via ORAL
  Filled 2014-01-03 (×7): qty 2

## 2014-01-03 MED ORDER — HYDROXYZINE HCL 25 MG PO TABS
50.0000 mg | ORAL_TABLET | Freq: Three times a day (TID) | ORAL | Status: DC | PRN
  Administered 2014-01-03 – 2014-01-04 (×2): 50 mg via ORAL
  Filled 2014-01-03 (×2): qty 2
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
## 2014-01-03 NOTE — Progress Notes (Signed)
Patient requests new iv site d/t observed trace drainage at original insertion site, lac saline locked with good blood return. IVF to RW good blood return, pt tolerated very well, first attempt
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
## 2014-01-03 NOTE — Progress Notes (Signed)
OT Cancellation Note  Patient Details Name: Linda [name] MRN: [id number] DOB: 11/09/1980   Cancelled Treatment:    Reason Eval/Treat Not Completed:  (Pt currently working with PT.  Will continue to follow.)  Evern BioMayberry, Cataldo Cosgriff Lynn 01/03/2014, 2:11 PM

## 2014-01-03 NOTE — Progress Notes (Signed)
Orthopedic Tech Progress Note Patient Details:  Linda ManningKiwanna [name] March 14, 1981 [id number] Patient refused OHF application stating she did not think she would be able to use it Patient ID: Linda [name], female   DOB: March 14, 1981, 33 y.o.   MRN: [id number]   Linda [name] 01/03/2014, 12:03 PM

## 2014-01-04 ENCOUNTER — Encounter (HOSPITAL_COMMUNITY): Payer: Self-pay | Admitting: Orthopedic Surgery

## 2014-01-04 LAB — CBC
HEMATOCRIT: 31.8 % — AB (ref 36.0–46.0)
HEMOGLOBIN: 11.1 g/dL — AB (ref 12.0–15.0)
MCH: 29.2 pg (ref 26.0–34.0)
MCHC: 34.9 g/dL (ref 30.0–36.0)
MCV: 83.7 fL (ref 78.0–100.0)
Platelets: 235 10*3/uL (ref 150–400)
RBC: 3.8 MIL/uL — ABNORMAL LOW (ref 3.87–5.11)
RDW: 12.8 % (ref 11.5–15.5)
WBC: 8.3 10*3/uL (ref 4.0–10.5)

## 2014-01-04 LAB — BASIC METABOLIC PANEL
Anion gap: 12 (ref 5–15)
BUN: 5 mg/dL — AB (ref 6–23)
CALCIUM: 8.2 mg/dL — AB (ref 8.4–10.5)
CO2: 25 mEq/L (ref 19–32)
Chloride: 102 mEq/L (ref 96–112)
Creatinine, Ser: 0.61 mg/dL (ref 0.50–1.10)
GFR calc Af Amer: >90 mL/min (ref >90)
GFR calc non Af Amer: >90 mL/min (ref >90)
GLUCOSE: 89 mg/dL (ref 70–99)
POTASSIUM: 3.8 meq/L (ref 3.7–5.3)
Sodium: 139 mEq/L (ref 137–147)

## 2014-01-04 MED ORDER — HYDROCODONE-ACETAMINOPHEN 10-325 MG PO TABS
1.0000 | ORAL_TABLET | Freq: Four times a day (QID) | ORAL | Status: DC | PRN
  Administered 2014-01-04: 1 via ORAL
  Administered 2014-01-05: 2 via ORAL
  Administered 2014-01-05: 1 via ORAL
  Filled 2014-01-04: qty 1
  Filled 2014-01-04: qty 2
  Filled 2014-01-04: qty 1

## 2014-01-04 MED ORDER — PREGABALIN 75 MG PO CAPS
75.0000 mg | ORAL_CAPSULE | Freq: Two times a day (BID) | ORAL | Status: DC
  Administered 2014-01-04 – 2014-01-05 (×3): 75 mg via ORAL
  Filled 2014-01-04: qty 1
  Filled 2014-01-04: qty 3
  Filled 2014-01-04: qty 1

## 2014-01-04 MED ORDER — OXYCODONE HCL 5 MG PO TABS
5.0000 mg | ORAL_TABLET | ORAL | Status: DC | PRN
  Administered 2014-01-04 – 2014-01-05 (×4): 10 mg via ORAL
  Filled 2014-01-04 (×5): qty 2
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
## 2014-01-04 NOTE — Progress Notes (Signed)
Seen and agreed 01/04/2014 Dailey Alberson Elizabeth PTA [phone] pager [phone] office    
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
## 2014-01-05 MED ORDER — METHOCARBAMOL 500 MG PO TABS: 500.0000 mg | ORAL_TABLET | Freq: Four times a day (QID) | ORAL | Status: AC | PRN

## 2014-01-05 MED ORDER — HYDROCODONE-ACETAMINOPHEN 10-325 MG PO TABS: 1.0000 | ORAL_TABLET | Freq: Four times a day (QID) | ORAL | Status: AC | PRN

## 2014-01-05 MED ORDER — PREGABALIN 75 MG PO CAPS: 75.0000 mg | ORAL_CAPSULE | Freq: Two times a day (BID) | ORAL | Status: AC

## 2014-01-05 MED ORDER — DSS 100 MG PO CAPS: 100.0000 mg | ORAL_CAPSULE | Freq: Two times a day (BID) | ORAL | Status: AC

## 2014-01-05 MED ORDER — OXYCODONE HCL 5 MG PO TABS: 5.0000 mg | ORAL_TABLET | ORAL | Status: AC | PRN

## 2014-01-05 MED ORDER — HYDROXYZINE HCL 50 MG PO TABS: 50.0000 mg | ORAL_TABLET | Freq: Three times a day (TID) | ORAL | Status: AC | PRN

## 2014-01-05 NOTE — Progress Notes (Signed)
I saw and examined the patient with Linda [name], communicating the findings and plan noted above.  [name], MD [facility] [phone] (p)  

## 2014-01-05 NOTE — Progress Notes (Signed)
Orthopedic Tech Progress Note Patient Details:  Chari ManningKiwanna Socarras Dec 18, 1980 [id number]  Ortho Devices Type of Ortho Device: Crutches Ortho Device/Splint Interventions: Application   Shawnie PonsCammer, Hanley Rispoli Carol 01/05/2014, 12:03 PM

## 2014-01-05 NOTE — Progress Notes (Signed)
Seen and agreed 01/05/2014 Robinette, Julia Elizabeth PTA [phone] pager [phone] office    

## 2014-01-05 NOTE — Discharge Instructions (Signed)
[facility] Discharge Instructions   General Discharge Instructions  WEIGHT BEARING STATUS: Nonweightbearing Right leg  RANGE OF MOTION/ACTIVITY: no ankle range of motion. Do not remove splint, do not get splint wet.  Ok to move R knee as tolerated and wiggle toes   Diet: as you were eating previously.  Can use over the counter stool softeners and bowel preparations, such as Miralax, to help with bowel movements.  Narcotics can be constipating.  Be sure to drink plenty of fluids  STOP SMOKING OR USING NICOTINE PRODUCTS!!!!  As discussed nicotine severely impairs your body's ability to heal surgical and traumatic wounds but also impairs bone healing.  Wounds and bone heal by forming microscopic blood vessels (angiogenesis) and nicotine is a vasoconstrictor (essentially, shrinks blood vessels).  Therefore, if vasoconstriction occurs to these microscopic blood vessels they essentially disappear and are unable to deliver necessary nutrients to the healing tissue.  This is one modifiable factor that you can do to dramatically increase your chances of healing your injury.    (This means no smoking, no nicotine gum, patches, etc)  DO NOT USE NONSTEROIDAL ANTI-INFLAMMATORY DRUGS (NSAID'S)  Using products such as Advil (ibuprofen), Aleve (naproxen), Motrin (ibuprofen) for additional pain control during fracture healing can delay and/or prevent the healing response.  If you would like to take over the counter (OTC) medication, Tylenol (acetaminophen) is ok.  However, some narcotic medications that are given for pain control contain acetaminophen as well. Therefore, you should not exceed more than 4000 mg of tylenol in a day if you do not have liver disease.  Also note that there are may OTC medicines, such as cold medicines and allergy medicines that my contain tylenol as well.  If you have any questions about medications and/or interactions please ask your doctor/PA or your pharmacist.   PAIN  MEDICATION USE AND EXPECTATIONS  You have likely been given narcotic medications to help control your pain.  After a traumatic event that results in an fracture (broken bone) with or without surgery, it is ok to use narcotic pain medications to help control one's pain.  We understand that everyone responds to pain differently and each individual patient will be evaluated on a regular basis for the continued need for narcotic medications. Ideally, narcotic medication use should last no more than 6-8 weeks (coinciding with fracture healing).   As a patient it is your responsibility as well to monitor narcotic medication use and report the amount and frequency you use these medications when you come to your office visit.   We would also advise that if you are using narcotic medications, you should take a dose prior to therapy to maximize you participation.  IF YOU ARE ON NARCOTIC MEDICATIONS IT IS NOT PERMISSIBLE TO OPERATE A MOTOR VEHICLE (MOTORCYCLE/CAR/TRUCK/MOPED) OR HEAVY MACHINERY DO NOT MIX NARCOTICS WITH OTHER CNS (CENTRAL NERVOUS SYSTEM) DEPRESSANTS SUCH AS ALCOHOL       ICE AND ELEVATE INJURED/OPERATIVE EXTREMITY  Using ice and elevating the injured extremity above your heart can help with swelling and pain control.  Icing in a pulsatile fashion, such as 20 minutes on and 20 minutes off, can be followed.    Do not place ice directly on skin. Make sure there is a barrier between to skin and the ice pack.    Using frozen items such as frozen peas works well as the conform nicely to the are that needs to be iced.  USE AN ACE WRAP OR TED HOSE FOR SWELLING  CONTROL  In addition to icing and elevation, Ace wraps or TED hose are used to help limit and resolve swelling.  It is recommended to use Ace wraps or TED hose until you are informed to stop.    When using Ace Wraps start the wrapping distally (farthest away from the body) and wrap proximally (closer to the body)   Example: If you had surgery  on your leg or thing and you do not have a splint on, start the ace wrap at the toes and work your way up to the thigh        If you had surgery on your upper extremity and do not have a splint on, start the ace wrap at your fingers and work your way up to the upper arm  IF YOU ARE IN A SPLINT OR CAST DO NOT REMOVE IT FOR ANY REASON   If your splint gets wet for any reason please contact the office immediately. You may shower in your splint or cast as long as you keep it dry.  This can be done by wrapping in a cast cover or garbage back (or similar)  Do Not stick any thing down your splint or cast such as pencils, money, or hangers to try and scratch yourself with.  If you feel itchy take benadryl as prescribed on the bottle for itching  IF YOU ARE IN A CAM BOOT (BLACK BOOT)  You may remove boot periodically. Perform daily dressing changes as noted below.  Wash the liner of the boot regularly and wear a sock when wearing the boot. It is recommended that you sleep in the boot until told otherwise  CALL THE OFFICE WITH ANY QUESTIONS OR CONCERTS: [phone]     Discharge Pin Site Instructions  Dress pins daily with Kerlix roll starting on POD 2. Wrap the Kerlix so that it tamps the skin down around the pin-skin interface to prevent/limit motion of the skin relative to the pin.  (Pin-skin motion is the primary cause of pain and infection related to external fixator pin sites).  Remove any crust or coagulum that may obstruct drainage with a saline moistened gauze or soap and water.  After POD 3, if there is no discernable drainage on the pin site dressing, the interval for change can by increased to every other day.  You may shower with the fixator, cleaning all pin sites gently with soap and water.  If you have a surgical wound this needs to be completely dry and without drainage before showering.  The extremity can be lifted by the fixator to facilitate wound care and transfers.  Notify the  office/Doctor if you experience increasing drainage, redness, or pain from a pin site, or if you notice purulent (thick, snot-like) drainage.  Discharge Wound Care Instructions  Do NOT apply any ointments, solutions or lotions to pin sites or surgical wounds.  These prevent needed drainage and even though solutions like hydrogen peroxide kill bacteria, they also damage cells lining the pin sites that help fight infection.  Applying lotions or ointments can keep the wounds moist and can cause them to breakdown and open up as well. This can increase the risk for infection. When in doubt call the office.  Surgical incisions should be dressed daily.  If any drainage is noted, use one layer of adaptic, then gauze, Kerlix, and an ace wrap.  Once the incision is completely dry and without drainage, it may be left open to air out.  Showering may  begin 36-48 hours later.  Cleaning gently with soap and water.  Traumatic wounds should be dressed daily as well.    One layer of adaptic, gauze, Kerlix, then ace wrap.  The adaptic can be discontinued once the draining has ceased    If you have a wet to dry dressing: wet the gauze with saline the squeeze as much saline out so the gauze is moist (not soaking wet), place moistened gauze over wound, then place a dry gauze over the moist one, followed by Kerlix wrap, then ace wrap.
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
## 2014-01-05 NOTE — Progress Notes (Signed)
Physical Therapy Treatment Patient Details Name: Linda [name] MRN: [id number] DOB: August 11, 1980 Today's Date: 01/05/2014    History of Present Illness 33 y.o. female s/p OPEN REDUCTION INTERNAL FIXATION (ORIF) TALUS FRACTURE right.    PT Comments    Pt did very well amb with crutches and shows good UE strength and in LLE. Pt did well with stair training. Pt planning to D/C home today.   Follow Up Recommendations  No PT follow up     Equipment Recommendations  3in1 (PT);Wheelchair (measurements PT);Wheelchair cushion (measurements PT)    Recommendations for Other Services       Precautions / Restrictions Precautions Precautions: Fall Restrictions Weight Bearing Restrictions: Yes RLE Weight Bearing: Weight bearing as tolerated    Mobility  Bed Mobility Overal bed mobility: Modified Independent                Transfers Overall transfer level: Modified independent                  Ambulation/Gait Ambulation/Gait assistance: Supervision Ambulation Distance (Feet): 300 Feet Assistive device: Crutches       General Gait Details: supervision for safety. Pt has good control of crutches and is able to amb efficiently with them. good sequencing and hand placements   Stairs Stairs: Yes Stairs assistance: Supervision Stair Management: One rail Right;Forwards;With crutches Number of Stairs: 3 General stair comments: Pt did very well with remembering stair instructions and verbal cues. Pt independent with stairs and will be able to do them on her own at home   Wheelchair Mobility    Modified Rankin (Stroke Patients Only)       Balance                                    Cognition Arousal/Alertness: Awake/alert Behavior During Therapy: WFL for tasks assessed/performed Overall Cognitive Status: Within Functional Limits for tasks assessed                      Exercises      General Comments        Pertinent Vitals/Pain no  apparent distress     Home Living                      Prior Function            PT Goals (current goals can now be found in the care plan section) Progress towards PT goals: Progressing toward goals    Frequency  Min 5X/week    PT Plan      Co-evaluation             End of Session Equipment Utilized During Treatment: Gait belt Activity Tolerance: Patient tolerated treatment well Patient left: in bed;with family/visitor present;with call bell/phone within reach     Time: 1017-1057 PT Time Calculation (min): 40 min  Charges:                       G Codes:      BRASFIELD,Katryn Plummer,SPTA 01/05/2014, 11:06 AM
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
## 2014-01-05 NOTE — Progress Notes (Signed)
I saw and examined the patient with Mr. Paul, communicating the findings and plan noted above.  Wolf Boulay, MD [facility] [phone] (p)  

## 2014-05-10 ENCOUNTER — Other Ambulatory Visit: Payer: Self-pay | Admitting: Orthopedic Surgery

## 2014-05-10 DIAGNOSIS — S92111K Displaced fracture of neck of right talus, subsequent encounter for fracture with nonunion: Secondary | ICD-10-CM

## 2014-05-11 ENCOUNTER — Ambulatory Visit
Admission: RE | Admit: 2014-05-11 | Discharge: 2014-05-11 | Disposition: A | Discharge status: Home / Self Care | Payer: BC Managed Care – PPO | Source: Ambulatory Visit | Attending: Orthopedic Surgery | Admitting: Orthopedic Surgery

## 2014-05-11 DIAGNOSIS — S92111K Displaced fracture of neck of right talus, subsequent encounter for fracture with nonunion: Secondary | ICD-10-CM

## 2014-06-21 ENCOUNTER — Encounter (HOSPITAL_COMMUNITY): Payer: Self-pay | Admitting: Orthopedic Surgery

## 2015-04-16 ENCOUNTER — Other Ambulatory Visit: Payer: Self-pay | Admitting: Orthopedic Surgery

## 2015-04-16 DIAGNOSIS — S92111A Displaced fracture of neck of right talus, initial encounter for closed fracture: Secondary | ICD-10-CM

## 2015-04-16 DIAGNOSIS — M87274 Osteonecrosis due to previous trauma, right foot: Secondary | ICD-10-CM

## 2015-04-16 DIAGNOSIS — M87271 Osteonecrosis due to previous trauma, right ankle: Secondary | ICD-10-CM

## 2015-04-30 ENCOUNTER — Other Ambulatory Visit: Payer: BC Managed Care – PPO

## 2015-04-30 ENCOUNTER — Ambulatory Visit
Admission: RE | Admit: 2015-04-30 | Discharge: 2015-04-30 | Disposition: A | Discharge status: Home / Self Care | Payer: BC Managed Care – PPO | Source: Ambulatory Visit | Attending: Orthopedic Surgery | Admitting: Orthopedic Surgery

## 2015-04-30 DIAGNOSIS — M87271 Osteonecrosis due to previous trauma, right ankle: Secondary | ICD-10-CM

## 2015-05-29 ENCOUNTER — Other Ambulatory Visit: Payer: Self-pay | Admitting: Orthopedic Surgery

## 2015-05-29 DIAGNOSIS — S92111K Displaced fracture of neck of right talus, subsequent encounter for fracture with nonunion: Secondary | ICD-10-CM

## 2015-06-07 ENCOUNTER — Other Ambulatory Visit: Payer: BC Managed Care – PPO

## 2015-06-14 ENCOUNTER — Ambulatory Visit
Admission: RE | Admit: 2015-06-14 | Discharge: 2015-06-14 | Disposition: A | Discharge status: Home / Self Care | Payer: BC Managed Care – PPO | Source: Ambulatory Visit | Attending: Orthopedic Surgery | Admitting: Orthopedic Surgery

## 2015-06-14 DIAGNOSIS — S92111K Displaced fracture of neck of right talus, subsequent encounter for fracture with nonunion: Secondary | ICD-10-CM

## 2015-09-19 IMAGING — CR DG ANKLE PORT 2V*R*
2 series · 2 of 2 positions shown · non-contrast
Comparison: CT right ankle and right lower extremity radiographs
01/02/2014

CLINICAL DATA: Postop right ankle.

EXAM:
PORTABLE RIGHT ANKLE - 2 VIEW

[ap]
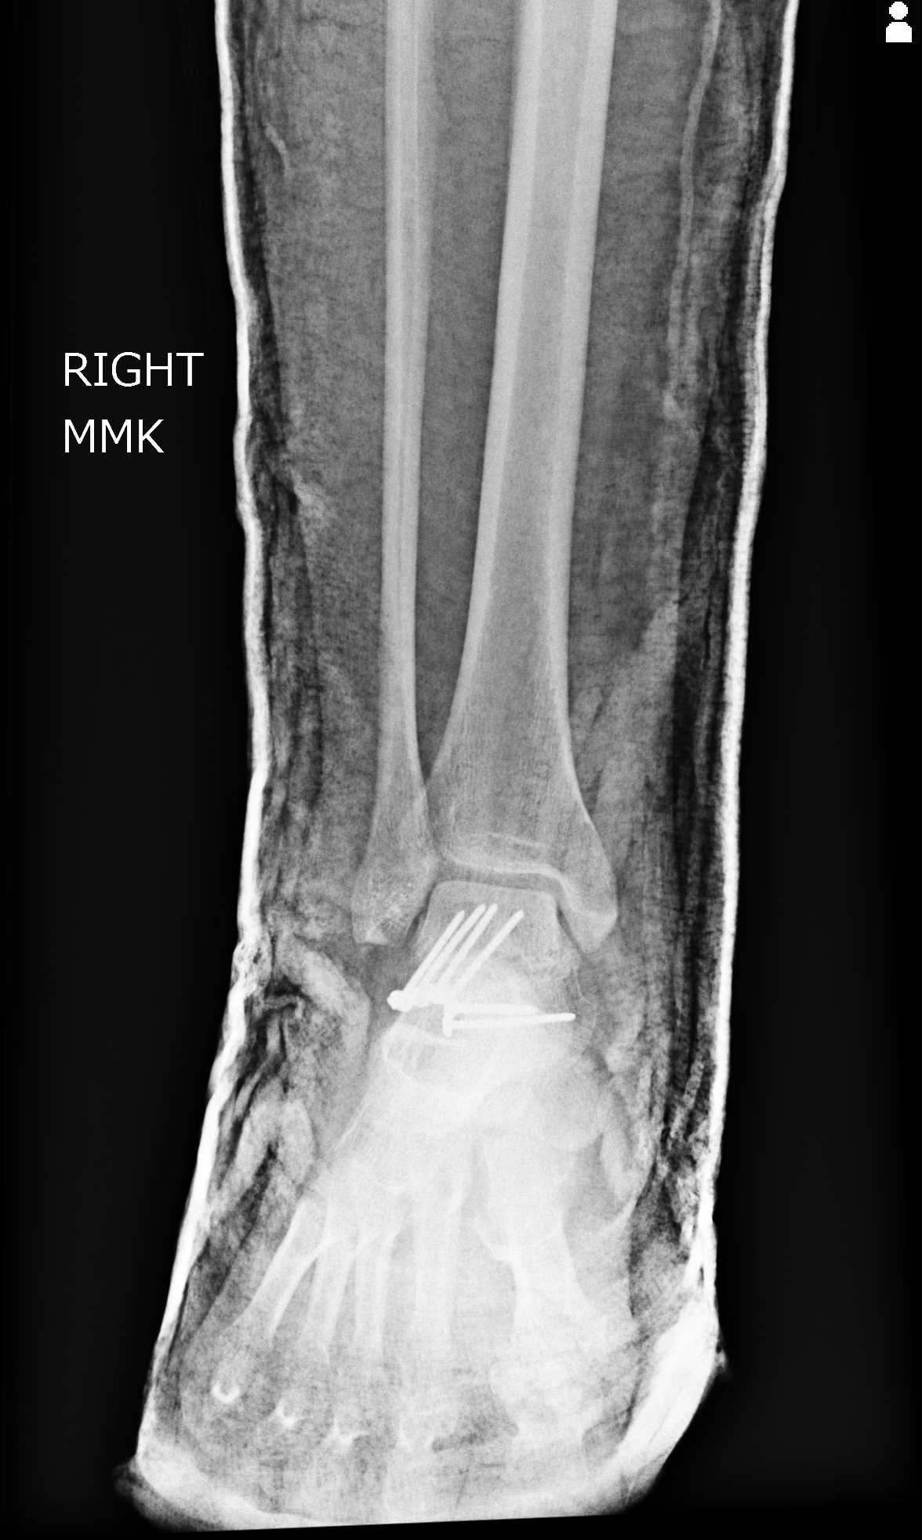

[oblique]
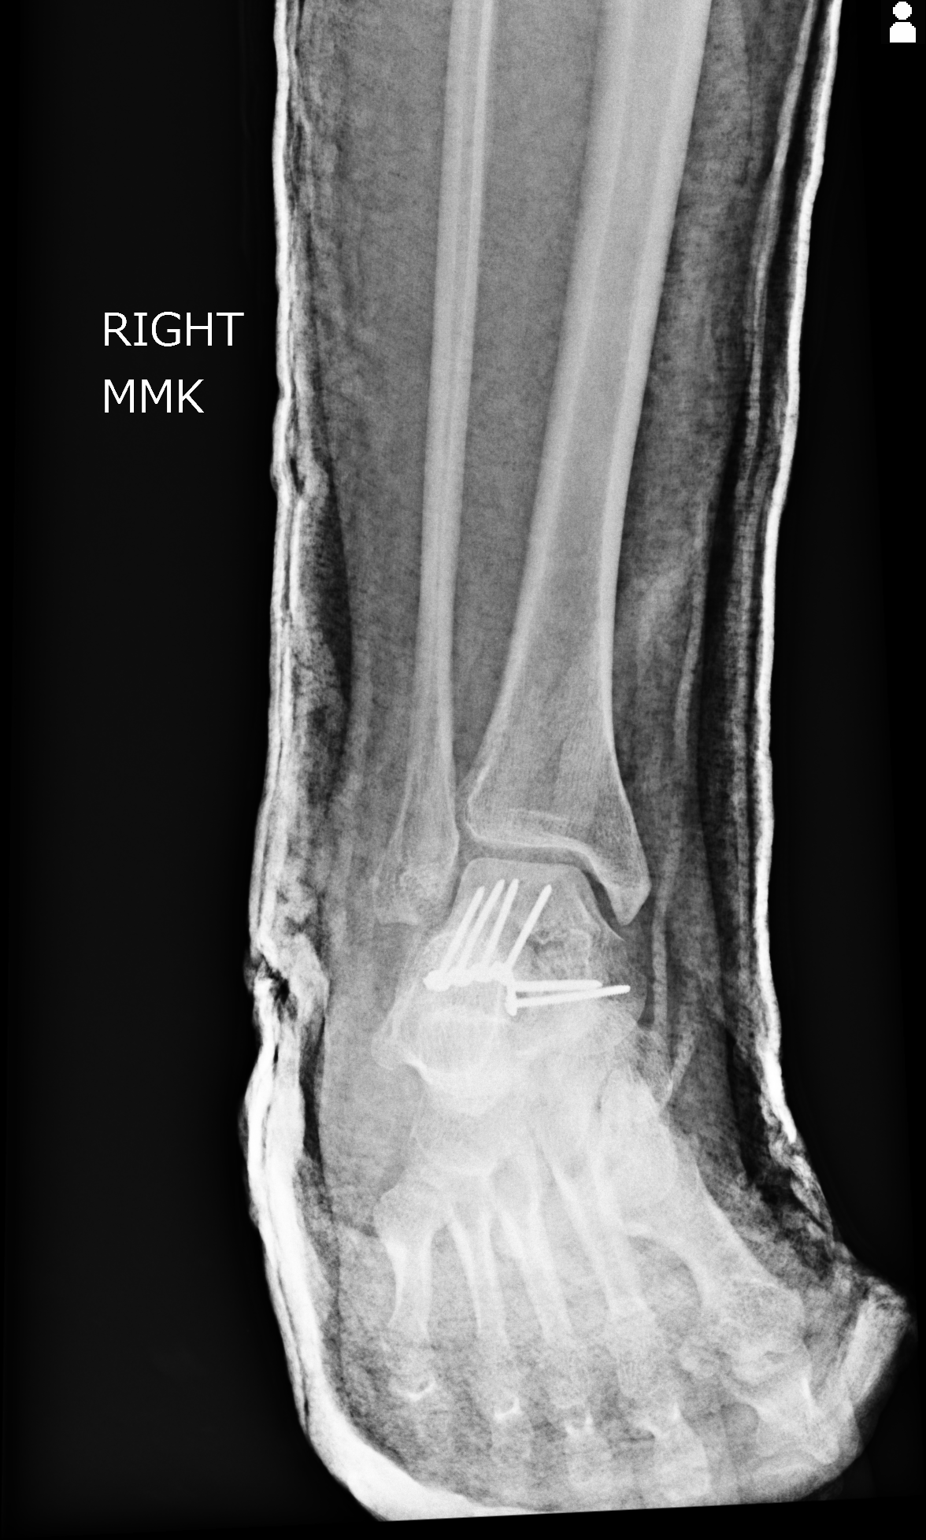

[2 of 2 positions shown; findings below may reference images not displayed]

FINDINGS: Patient is status post ORIF for talus fracture dislocation. Lateral
cortical plate and 6 screws are seen in the talus. Fracture
fragments of the talus are in anatomic alignment. Ankle mortise is
preserved. Surrounding cast material noted.
IMPRESSION: Status post ORIF foreign talus fracture dislocation. The ankle is
located and fracture fragments are in anatomic alignment.

## 2015-09-19 IMAGING — RF DG C-ARM 61-120 MIN
1 series · 6 of 6 positions shown · non-contrast
Comparison: CT 01/02/2014

CLINICAL DATA: ORIF for repair of right talus fracture dislocation.

EXAM:
RIGHT ANKLE - COMPLETE 3+ VIEW; RIGHT FOOT COMPLETE - 3+ VIEW; DG
C-ARM 61-120 MIN

[Series 1: run · 6 of 6 slices shown]
[im 1/6]
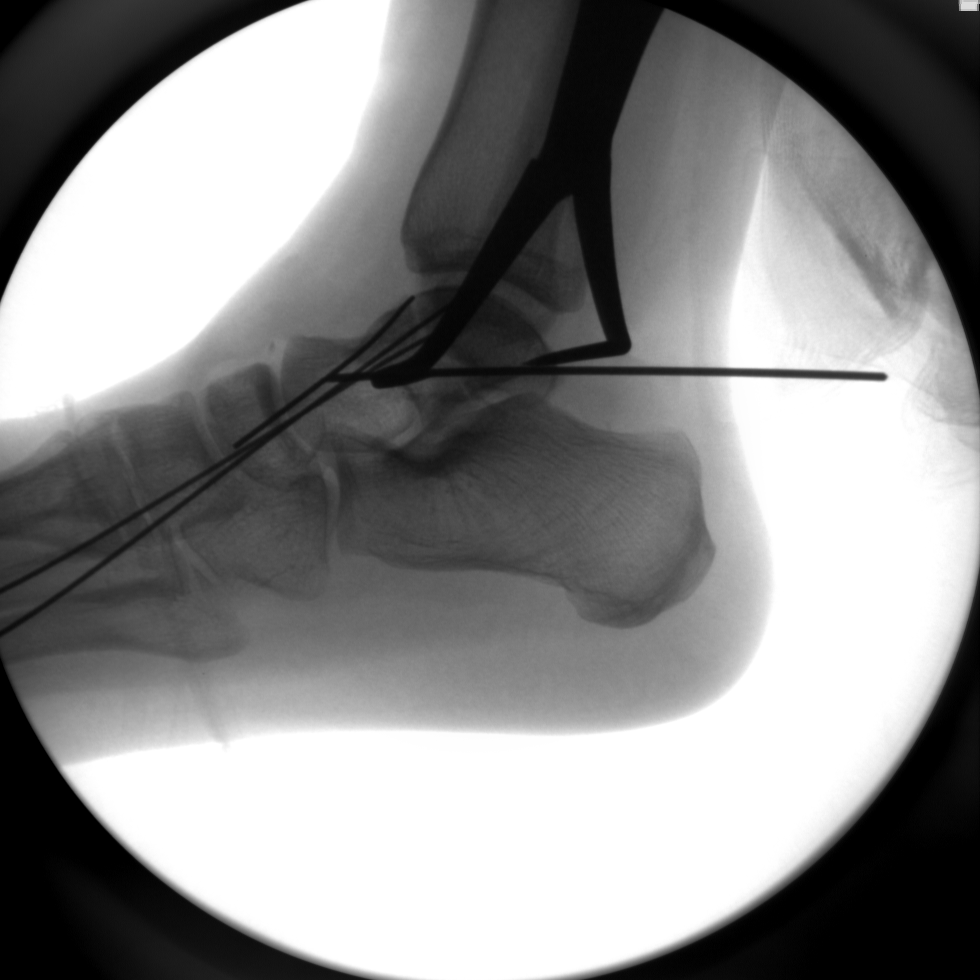
[im 2/6]
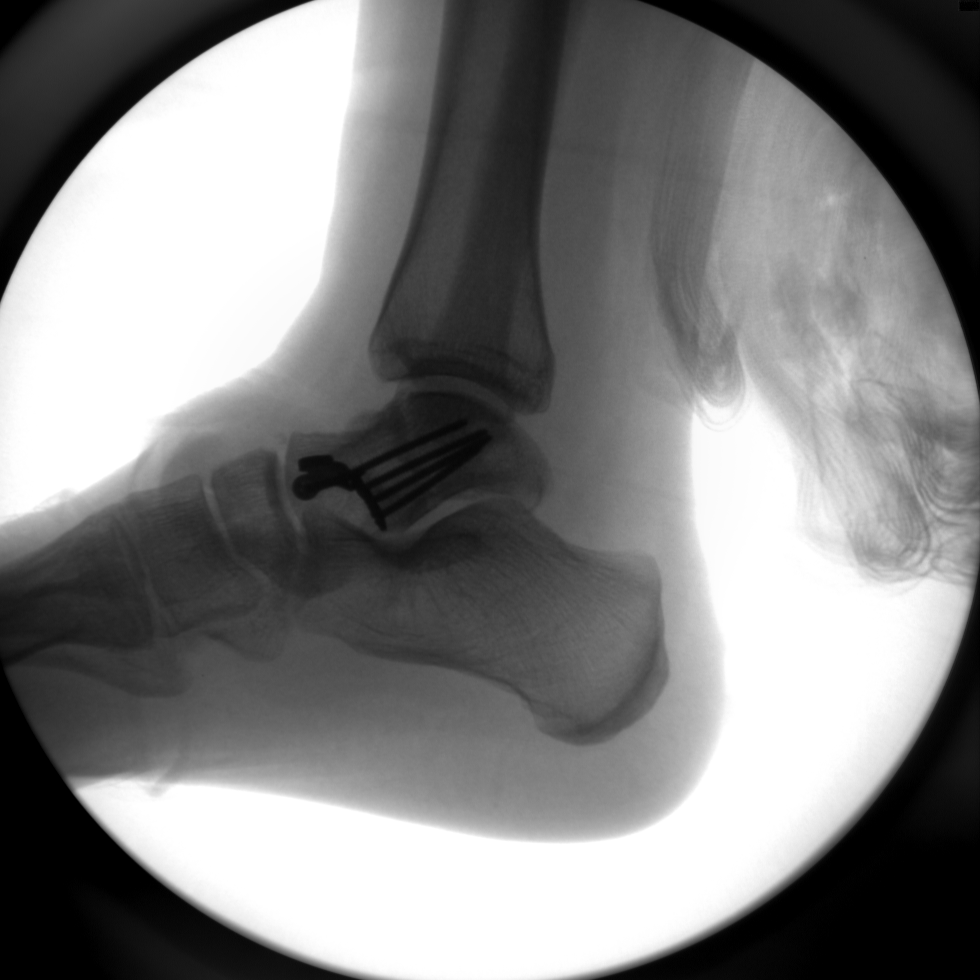
[im 3/6]
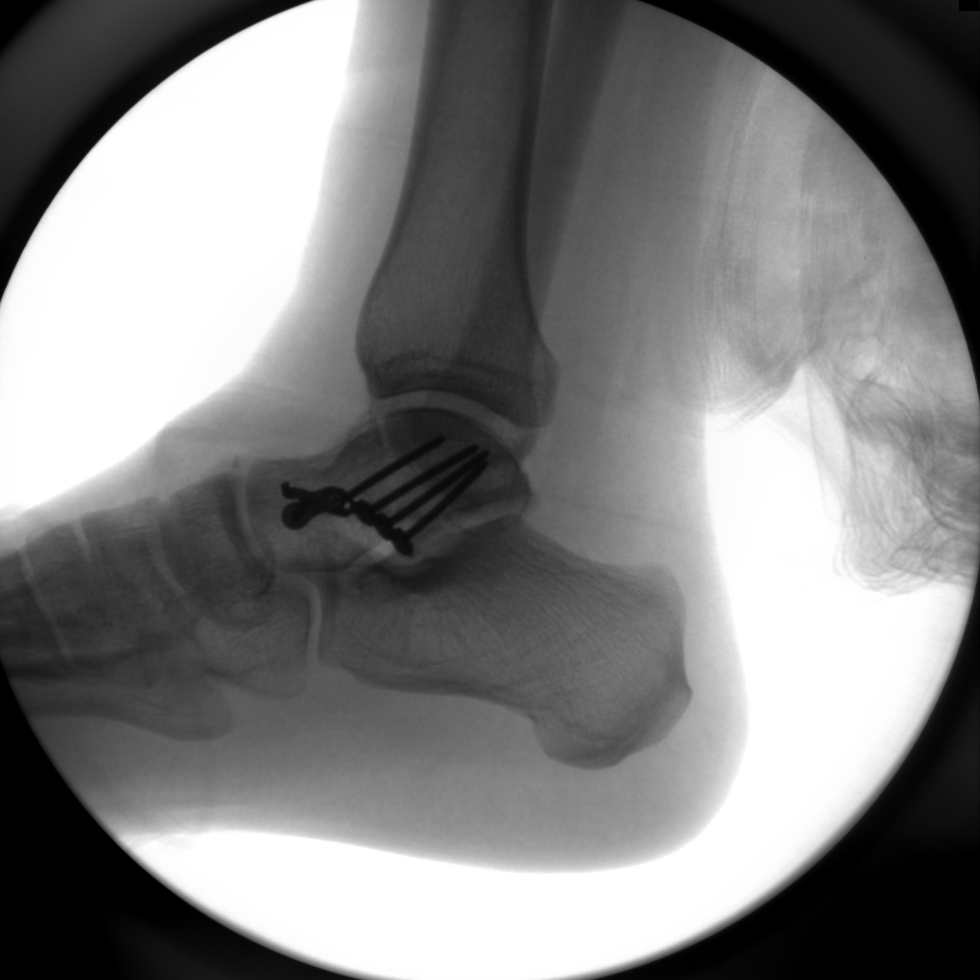
[im 4/6]
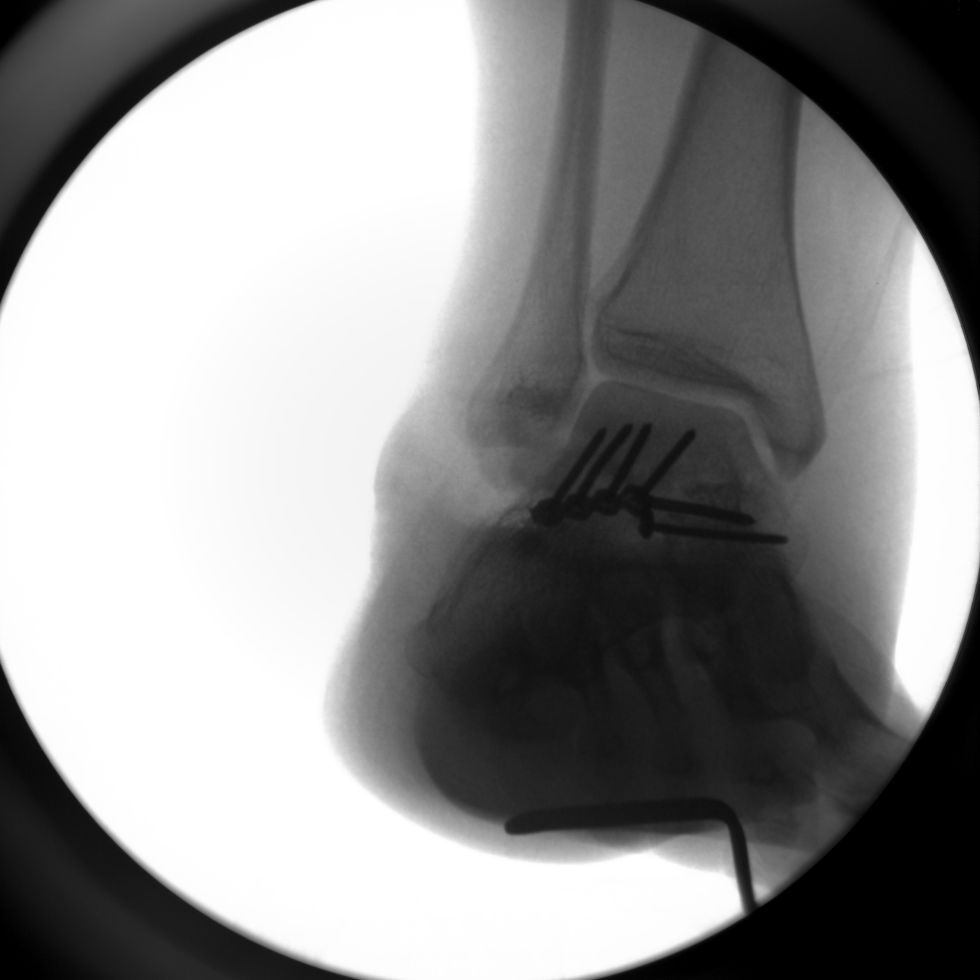
[im 5/6]
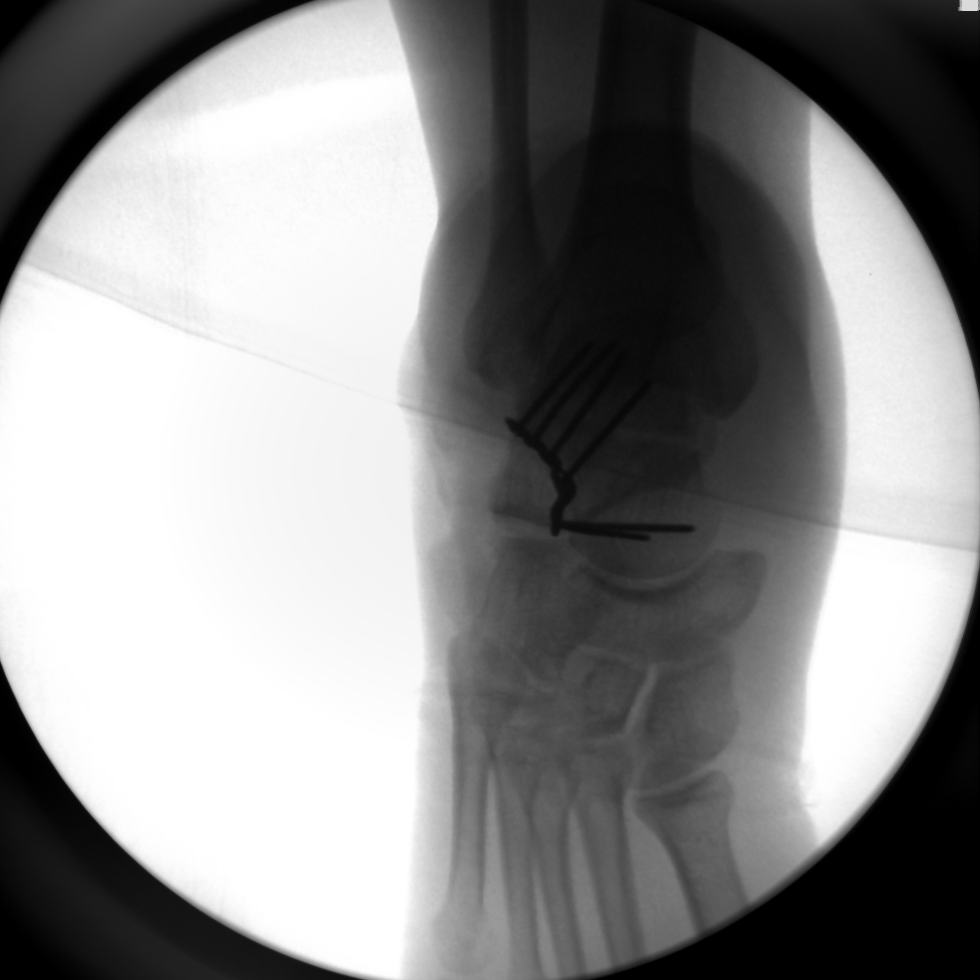
[im 6/6]
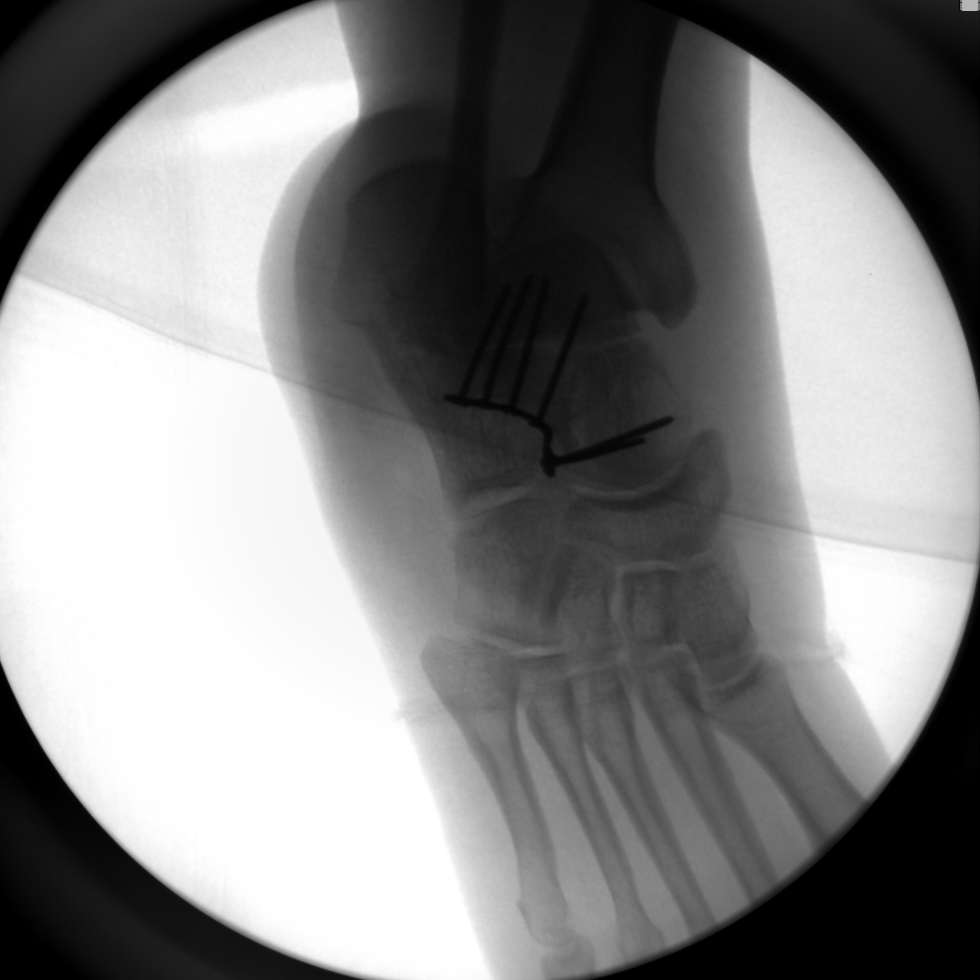

[6 of 6 positions shown; findings below may reference images not displayed]

FINDINGS: Intraoperative spot fluoroscopic views demonstrate ORIF 4 for talus
fracture dislocation. Cortical plate and multiple screws were placed
in the talus. Ankle joint is aligned in the mortise is intact.
Fracture fragments appear in near anatomic alignment. Adjacent soft
tissue swelling noted.
IMPRESSION: ORIF of right talus fracture dislocation. No complicating features
identified.

## 2017-02-28 IMAGING — CT CT FOOT*R* W/O CM
2 series · 15 of 27 positions shown, 19 images · non-contrast
Comparison: MRI of the right ankle 04/30/2015 and CT scan
05/11/2014.

CLINICAL DATA: History of talus fracture and subsequent fixation in
December 2013. Continued ankle pain with a pulling sensation. Question
avascular necrosis or arthritis. Subsequent encounter.

EXAM:
CT OF THE RIGHT FOOT WITHOUT CONTRAST
TECHNIQUE: Multidetector CT imaging of the right foot was performed according
to the standard protocol. Multiplanar CT image reconstructions were
also generated.

[Series 4: soft tissue lower extremity (person_name) · axial · 0.29mm/px · z∈[-198,-86]mm · 10 of 68 slices shown, 13 images]
[im 6/68  soft-tissue]
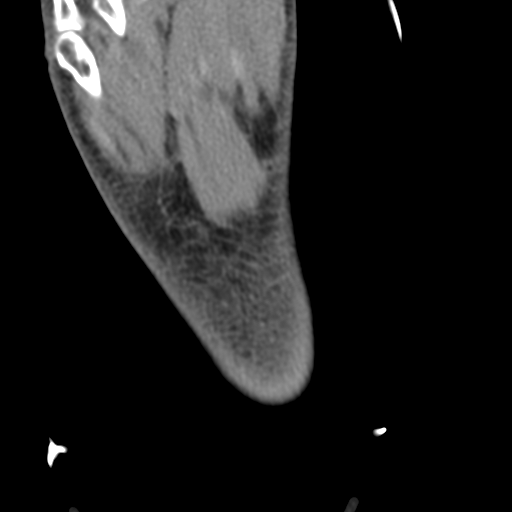
[im 6/68  bone]
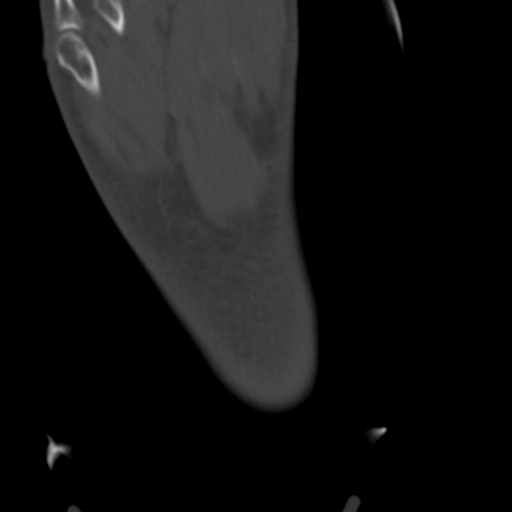
[im 11/68  bone]
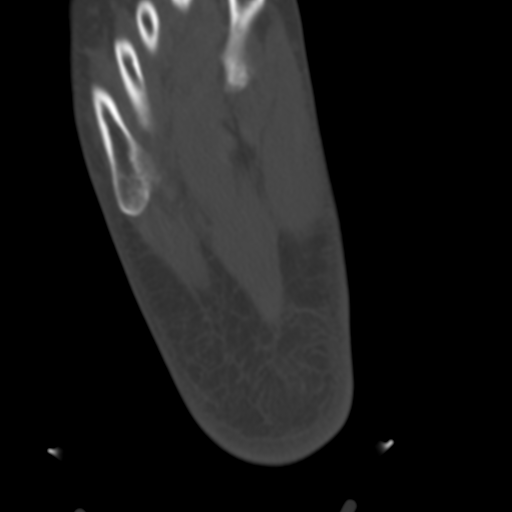
[im 21/68  bone]
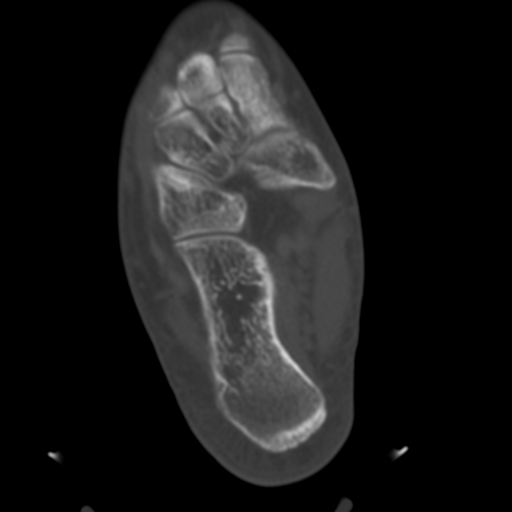
[im 26/68  bone]
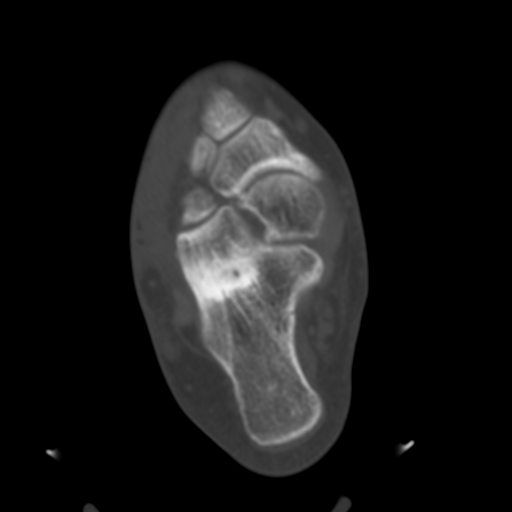
[im 31/68  soft-tissue]
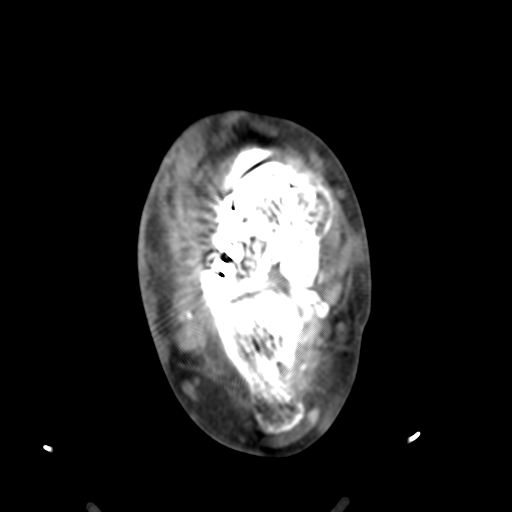
[im 31/68  bone]
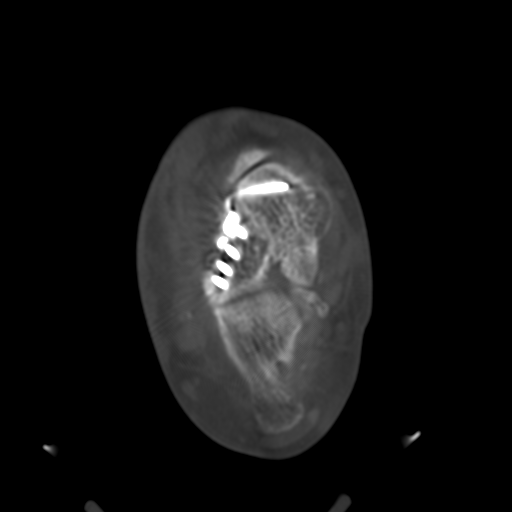
[im 37/68  bone]
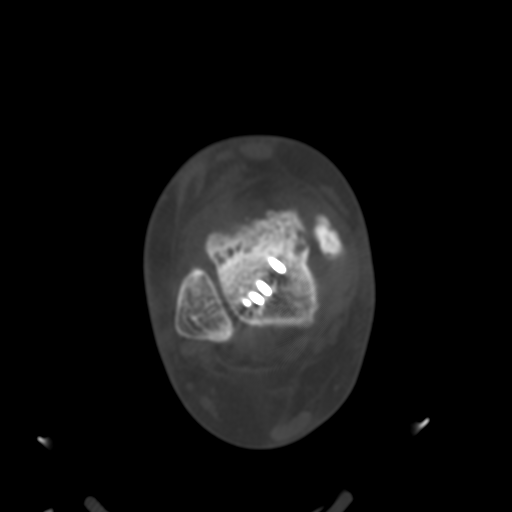
[im 42/68  bone]
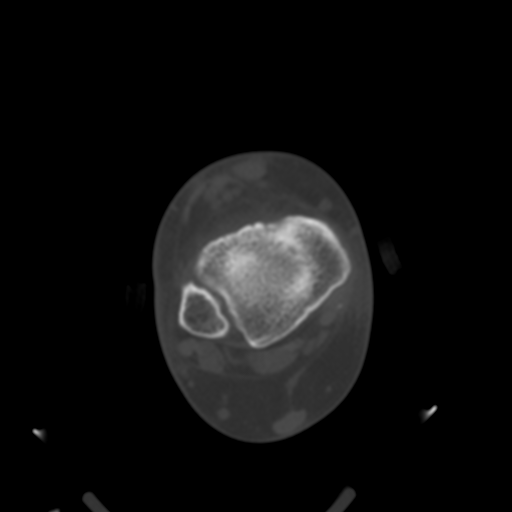
[im 52/68  bone]
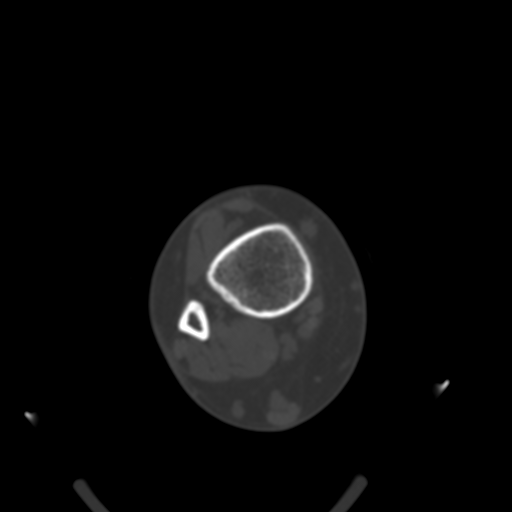
[im 57/68  soft-tissue]
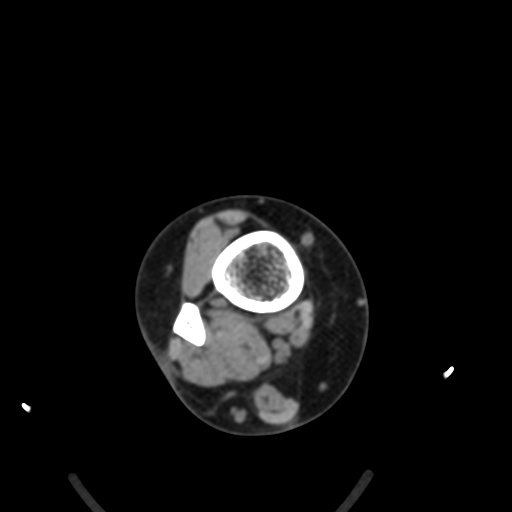
[im 57/68  bone]
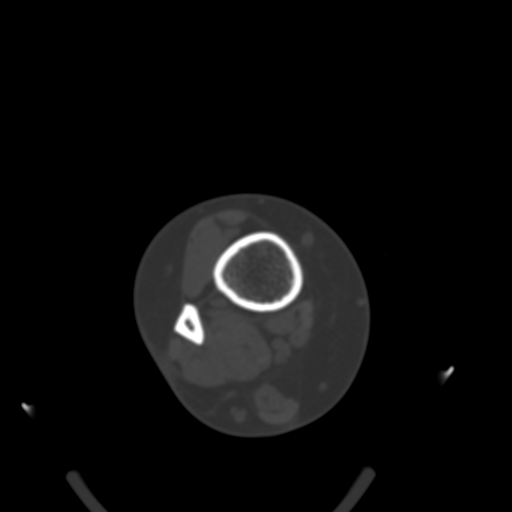
[im 62/68  bone]
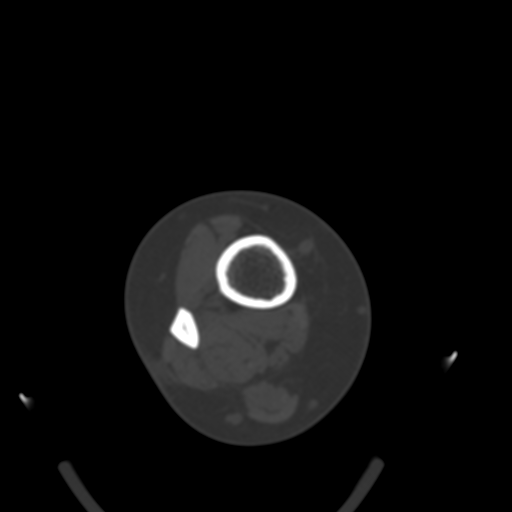

[Series 605: sag soft · sagittal · 0.29mm/px · 5 of 38 slices shown, 6 images]
[im 10/38  bone]
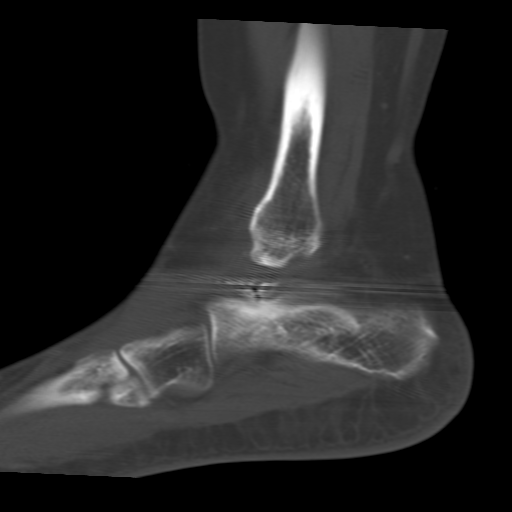
[im 16/38  bone]
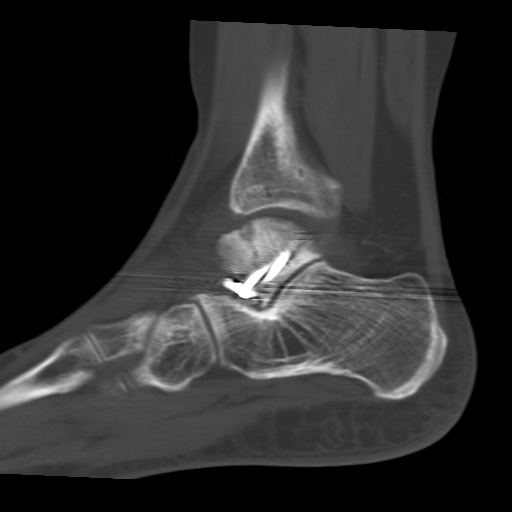
[im 19/38  soft-tissue]
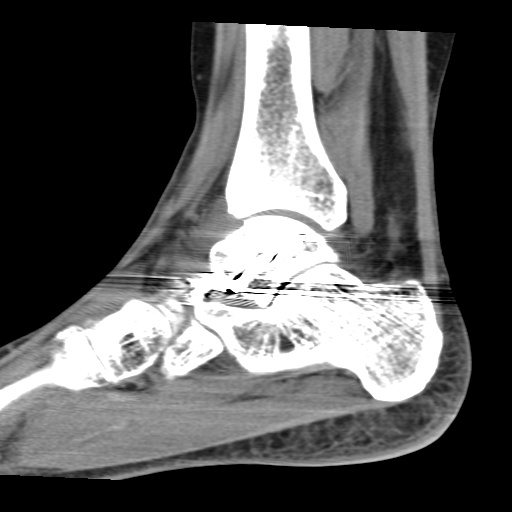
[im 19/38  bone]
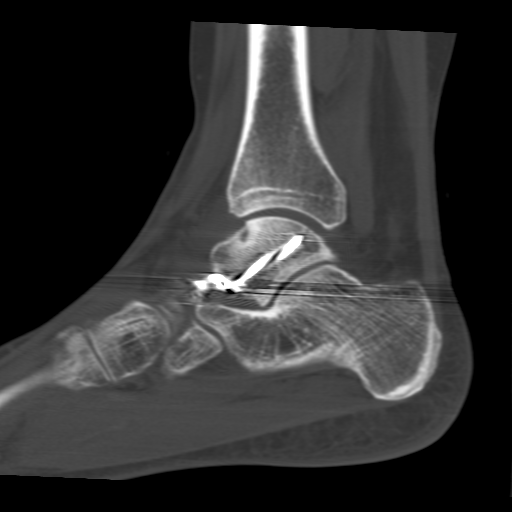
[im 22/38  bone]
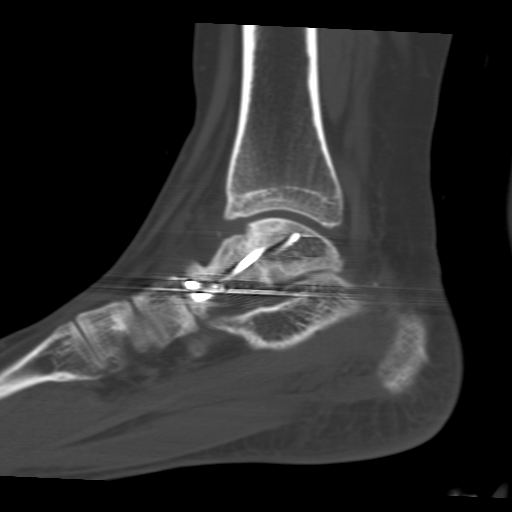
[im 25/38  bone]
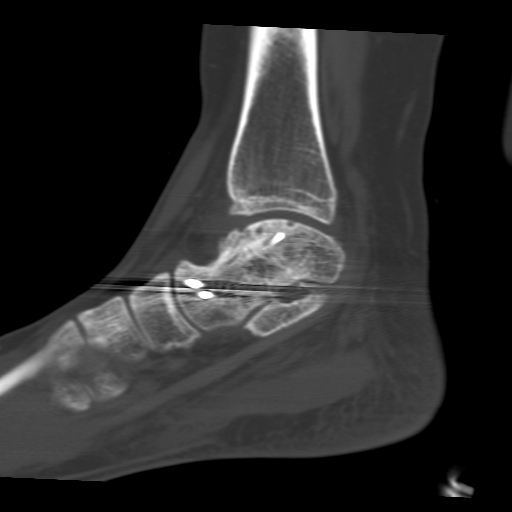

[15 of 27 positions shown; findings below may reference images not displayed]

FINDINGS: Plate and screw fixation of a fracture of the neck of the talus is
again seen. Hardware is intact without loosening or other
complicating feature. Position and alignment are unchanged. Since
the prior CT scan, there has been progressive healing of the
patient's fracture with bridging bone now seen across the superior
margin of the head neck junction of the talus.

A small step-off is seen at the anterior most margin of the talar
dome. There is a new small osteophyte off of the anterior aspect of
the tibia with small subchondral cysts seen in the talus anteriorly
and eccentric toward the medial side. Findings are consistent with
osteoarthritis. The appearance is not typical of avascular necrosis.
Mild degenerative change is also seen about the middle facet of the
subtalar joint. The posterior facet is unremarkable. Imaged joints
otherwise appear normal.

Soft tissues demonstrate thickening along the anterior margin of the
tibiotalar joint most compatible with synovitis. No joint effusion
is seen. Tendons about the ankle are unremarkable. Ligaments are not
well seen but no obvious tear is identified.
IMPRESSION: Progressive healing of a fracture of the talus with fixation
hardware in place.

Mild to moderate osteoarthritis along the anterior aspect of the
tibiotalar joint which has progressed since the patient's 7806
examination. The appearance is not typical of avascular necrosis.

Mild soft tissue thickening anterior to the tibiotalar joint
compatible with synovitis. Mild degenerative change about the middle
facet of the subtalar joint also identified.
